# Patient Record
Sex: Female | Born: 1965
Health system: Southern US, Community
[De-identification: ages and names within clinical notes are randomized; demographics above are authoritative.]

## PROBLEM LIST (undated history)

## (undated) DIAGNOSIS — T839XXA Unspecified complication of genitourinary prosthetic device, implant and graft, initial encounter: Secondary | ICD-10-CM

## (undated) DIAGNOSIS — N179 Acute kidney failure, unspecified: Secondary | ICD-10-CM

## (undated) DIAGNOSIS — R7301 Impaired fasting glucose: Secondary | ICD-10-CM

## (undated) DIAGNOSIS — E559 Vitamin D deficiency, unspecified: Secondary | ICD-10-CM

## (undated) HISTORY — DX: Impaired fasting glucose: R73.01

## (undated) HISTORY — DX: Unspecified complication of genitourinary prosthetic device, implant and graft, initial encounter: T83.9XXA

## (undated) HISTORY — DX: Acute kidney failure, unspecified: N17.9

## (undated) HISTORY — DX: Vitamin D deficiency, unspecified: E55.9

---

## 2013-07-14 ENCOUNTER — Ambulatory Visit: Payer: Self-pay | Admitting: General Surgery

## 2013-07-23 ENCOUNTER — Ambulatory Visit: Payer: Self-pay | Admitting: General Surgery

## 2013-07-23 ENCOUNTER — Encounter: Payer: Self-pay | Admitting: *Deleted

## 2014-07-01 ENCOUNTER — Telehealth: Payer: Self-pay

## 2014-07-01 NOTE — Telephone Encounter (Signed)
Left message for patient stating that even thou she does not need a pap, they do recommened yearly physicals to make sure everything is working well and get her mammo scheduled. Left message for her asking to call our office to sch appt.

## 2014-07-01 NOTE — Telephone Encounter (Signed)
Patient left message stating that she was told last year that she didn't need another pap for 3 years. She wants to know if she still needs to come in for a CPE?

## 2014-07-24 DIAGNOSIS — E559 Vitamin D deficiency, unspecified: Secondary | ICD-10-CM | POA: Insufficient documentation

## 2014-07-24 DIAGNOSIS — R7303 Prediabetes: Secondary | ICD-10-CM | POA: Insufficient documentation

## 2014-07-27 ENCOUNTER — Encounter: Payer: Self-pay | Admitting: Family Medicine

## 2014-07-27 ENCOUNTER — Ambulatory Visit (INDEPENDENT_AMBULATORY_CARE_PROVIDER_SITE_OTHER): Payer: BLUE CROSS/BLUE SHIELD | Admitting: Family Medicine

## 2014-07-27 VITALS — BP 127/79 | HR 50 | Temp 97.8°F | Ht 62.5 in | Wt 142.0 lb

## 2014-07-27 DIAGNOSIS — Z1239 Encounter for other screening for malignant neoplasm of breast: Secondary | ICD-10-CM

## 2014-07-27 DIAGNOSIS — E559 Vitamin D deficiency, unspecified: Secondary | ICD-10-CM

## 2014-07-27 DIAGNOSIS — R7301 Impaired fasting glucose: Secondary | ICD-10-CM

## 2014-07-27 DIAGNOSIS — Z0000 Encounter for general adult medical examination without abnormal findings: Secondary | ICD-10-CM

## 2014-07-27 NOTE — Assessment & Plan Note (Signed)
Check level today 

## 2014-07-27 NOTE — Progress Notes (Signed)
Patient ID: Blima Singer, female   DOB: 16-Feb-1965, 49 y.o.   MRN: [id number]   Subjective:   Julie Warren is a 49 y.o. female here for a complete physical exam  Interim issues since last visit: nothing She needs mammogram She went for the screening test and then had the diagnostic test, and she was supposed to see a breast surgeon; she talked to the radiologist; was there for 2-3 hours; the radiologist told her that he felt 100% comfortable with letting her go a year until her next test; she did not go see the breast surgeon based on his recommendation; he stressed yearly mammograms, really stressed that  Past Medical History  Diagnosis Date  . Vitamin D deficiency disease   . IFG (impaired fasting glucose)    Past Surgical History  Procedure Laterality Date  . Cesarean section     Family History  Problem Relation Age of Onset  . Cancer Mother     lung  . COPD Mother   . Diabetes Father   . Heart disease Father   . Hyperlipidemia Father   . Hypertension Father   . COPD Maternal Aunt    History  Substance Use Topics  . Smoking status: Never Smoker   . Smokeless tobacco: Never Used  . Alcohol Use: No   Review of Systems  Constitutional: Negative for fever and unexpected weight change.  HENT: Negative for nosebleeds.   Respiratory: Negative for cough and wheezing.   Cardiovascular: Negative for chest pain and leg swelling.  Gastrointestinal: Negative for blood in stool.  Endocrine: Positive for heat intolerance. Negative for cold intolerance and polydipsia.  Genitourinary: Negative for hematuria.  Musculoskeletal: Negative for joint swelling and arthralgias.  Allergic/Immunologic: Negative for environmental allergies.  Neurological: Negative for tremors.  Hematological: Negative for adenopathy. Does not bruise/bleed easily.  Psychiatric/Behavioral: Negative for dysphoric mood. The patient is not nervous/anxious.    Objective:   Filed Vitals:   07/27/14 1400   BP: 127/79  Pulse: 50  Temp: 97.8 F (36.6 C)  Height: 5' 2.5" (1.588 m)  Weight: 142 lb (64.411 kg)  SpO2: 100%   Body mass index is 25.54 kg/(m^2). Wt Readings from Last 3 Encounters:  07/27/14 142 lb (64.411 kg)  06/12/13 142 lb (64.411 kg)   Physical Exam  Constitutional: She appears well-developed and well-nourished. No distress.  HENT:  Head: Normocephalic and atraumatic.  Eyes: EOM are normal. No scleral icterus.  Neck: No thyromegaly present.  Cardiovascular: Normal rate, regular rhythm and normal heart sounds.   No murmur heard. Pulmonary/Chest: Effort normal and breath sounds normal. No respiratory distress. She has no wheezes. She exhibits no mass. Right breast exhibits no inverted nipple, no mass, no nipple discharge, no skin change and no tenderness. Left breast exhibits mass (slight fullness over the superior breast 12 o'clock region). Left breast exhibits no inverted nipple, no nipple discharge, no skin change and no tenderness. Breasts are symmetrical.  Abdominal: Soft. Bowel sounds are normal. She exhibits no distension.  Musculoskeletal: Normal range of motion. She exhibits no edema.  Neurological: She is alert. She exhibits normal muscle tone.  Skin: Skin is warm and dry. No rash noted. She is not diaphoretic. No cyanosis. No pallor.  Psychiatric: She has a normal mood and affect. Judgment and thought content normal. Her speech is not rapid and/or pressured (talkative, but redirectable). She is hyperactive. She is not agitated and not aggressive. Cognition and memory are normal.   Assessment/Plan:   Problem  List Items Addressed This Visit      Endocrine   IFG (impaired fasting glucose)    Check glucose and A1C        Other   Vitamin D deficiency disease    Check level today       Other Visit Diagnoses    Health maintenance examination    -  Primary    healthy living encouraged; age-appropriate counseling and guidance; she declined hiv screening;  mammogram ordered; labs ordered    Screening for breast cancer        Relevant Orders    MM Digital Screening       No orders of the defined types were placed in this encounter.   Orders Placed This Encounter  Procedures  . MM Digital Screening    Standing Status: Future     Number of Occurrences:      Standing Expiration Date: 10/27/2014    Order Specific Question:  Reason for Exam (SYMPTOM  OR DIAGNOSIS REQUIRED)    Answer:  screening    Order Specific Question:  Is the patient pregnant?    Answer:  No    Order Specific Question:  Preferred imaging location?    Answer:  [facility]    Follow up plan: No Follow-up on file.  An After Visit Summary was printed and given to the patient.

## 2014-07-27 NOTE — Patient Instructions (Addendum)
I've entered the order for your mammogram If you have not heard anything from my staff in a week about any orders/referrals/studies from today, please contact us here to follow-up [phone]   Health Maintenance Adopting a healthy lifestyle and getting preventive care can go a long way to promote health and wellness. Talk with your health care provider about what schedule of regular examinations is right for you. This is a good chance for you to check in with your provider about disease prevention and staying healthy. In between checkups, there are plenty of things you can do on your own. Experts have done a lot of research about which lifestyle changes and preventive measures are most likely to keep you healthy. Ask your health care provider for more information. WEIGHT AND DIET  Eat a healthy diet  Be sure to include plenty of vegetables, fruits, low-fat dairy products, and lean protein.  Do not eat a lot of foods high in solid fats, added sugars, or salt.  Get regular exercise. This is one of the most important things you can do for your health.  Most adults should exercise for at least 150 minutes each week. The exercise should increase your heart rate and make you sweat (moderate-intensity exercise).  Most adults should also do strengthening exercises at least twice a week. This is in addition to the moderate-intensity exercise.  Maintain a healthy weight  Body mass index (BMI) is a measurement that can be used to identify possible weight problems. It estimates body fat based on height and weight. Your health care provider can help determine your BMI and help you achieve or maintain a healthy weight.  For females 22 years of age and older:   A BMI below 18.5 is considered underweight.  A BMI of 18.5 to 24.9 is normal.  A BMI of 25 to 29.9 is considered overweight.  A BMI of 30 and above is considered obese.  Watch levels of cholesterol and blood lipids  You should start  having your blood tested for lipids and cholesterol at 49 years of age, then have this test every 5 years.  You may need to have your cholesterol levels checked more often if:  Your lipid or cholesterol levels are high.  You are older than 49 years of age.  You are at high risk for heart disease.  CANCER SCREENING   Lung Cancer  Lung cancer screening is recommended for adults 59-36 years old who are at high risk for lung cancer because of a history of smoking.  A yearly low-dose CT scan of the lungs is recommended for people who:  Currently smoke.  Have quit within the past 15 years.  Have at least a 30-pack-year history of smoking. A pack year is smoking an average of one pack of cigarettes a day for 1 year.  Yearly screening should continue until it has been 15 years since you quit.  Yearly screening should stop if you develop a health problem that would prevent you from having lung cancer treatment.  Breast Cancer  Practice breast self-awareness. This means understanding how your breasts normally appear and feel.  It also means doing regular breast self-exams. Let your health care provider know about any changes, no matter how small.  If you are in your 20s or 30s, you should have a clinical breast exam (CBE) by a health care provider every 1-3 years as part of a regular health exam.  If you are 2 or older, have a CBE  every year. Also consider having a breast X-ray (mammogram) every year.  If you have a family history of breast cancer, talk to your health care provider about genetic screening.  If you are at high risk for breast cancer, talk to your health care provider about having an MRI and a mammogram every year.  Breast cancer gene (BRCA) assessment is recommended for women who have family members with BRCA-related cancers. BRCA-related cancers include:  Breast.  Ovarian.  Tubal.  Peritoneal cancers.  Results of the assessment will determine the need for  genetic counseling and BRCA1 and BRCA2 testing. Cervical Cancer Routine pelvic examinations to screen for cervical cancer are no longer recommended for nonpregnant women who are considered low risk for cancer of the pelvic organs (ovaries, uterus, and vagina) and who do not have symptoms. A pelvic examination may be necessary if you have symptoms including those associated with pelvic infections. Ask your health care provider if a screening pelvic exam is right for you.   The Pap test is the screening test for cervical cancer for women who are considered at risk.  If you had a hysterectomy for a problem that was not cancer or a condition that could lead to cancer, then you no longer need Pap tests.  If you are older than 65 years, and you have had normal Pap tests for the past 10 years, you no longer need to have Pap tests.  If you have had past treatment for cervical cancer or a condition that could lead to cancer, you need Pap tests and screening for cancer for at least 20 years after your treatment.  If you no longer get a Pap test, assess your risk factors if they change (such as having a new sexual partner). This can affect whether you should start being screened again.  Some women have medical problems that increase their chance of getting cervical cancer. If this is the case for you, your health care provider may recommend more frequent screening and Pap tests.  The human papillomavirus (HPV) test is another test that may be used for cervical cancer screening. The HPV test looks for the virus that can cause cell changes in the cervix. The cells collected during the Pap test can be tested for HPV.  The HPV test can be used to screen women 58 years of age and older. Getting tested for HPV can extend the interval between normal Pap tests from three to five years.  An HPV test also should be used to screen women of any age who have unclear Pap test results.  After 49 years of age, women  should have HPV testing as often as Pap tests.  Colorectal Cancer  This type of cancer can be detected and often prevented.  Routine colorectal cancer screening usually begins at 49 years of age and continues through 49 years of age.  Your health care provider may recommend screening at an earlier age if you have risk factors for colon cancer.  Your health care provider may also recommend using home test kits to check for hidden blood in the stool.  A small camera at the end of a tube can be used to examine your colon directly (sigmoidoscopy or colonoscopy). This is done to check for the earliest forms of colorectal cancer.  Routine screening usually begins at age 30.  Direct examination of the colon should be repeated every 5-10 years through 49 years of age. However, you may need to be screened more often if  early forms of precancerous polyps or small growths are found. Skin Cancer  Check your skin from head to toe regularly.  Tell your health care provider about any new moles or changes in moles, especially if there is a change in a mole's shape or color.  Also tell your health care provider if you have a mole that is larger than the size of a pencil eraser.  Always use sunscreen. Apply sunscreen liberally and repeatedly throughout the day.  Protect yourself by wearing long sleeves, pants, a wide-brimmed hat, and sunglasses whenever you are outside. HEART DISEASE, DIABETES, AND HIGH BLOOD PRESSURE   Have your blood pressure checked at least every 1-2 years. High blood pressure causes heart disease and increases the risk of stroke.  If you are between 59 years and 46 years old, ask your health care provider if you should take aspirin to prevent strokes.  Have regular diabetes screenings. This involves taking a blood sample to check your fasting blood sugar level.  If you are at a normal weight and have a low risk for diabetes, have this test once every three years after 49 years  of age.  If you are overweight and have a high risk for diabetes, consider being tested at a younger age or more often. PREVENTING INFECTION  Hepatitis B  If you have a higher risk for hepatitis B, you should be screened for this virus. You are considered at high risk for hepatitis B if:  You were born in a country where hepatitis B is common. Ask your health care provider which countries are considered high risk.  Your parents were born in a high-risk country, and you have not been immunized against hepatitis B (hepatitis B vaccine).  You have HIV or AIDS.  You use needles to inject street drugs.  You live with someone who has hepatitis B.  You have had sex with someone who has hepatitis B.  You get hemodialysis treatment.  You take certain medicines for conditions, including cancer, organ transplantation, and autoimmune conditions. Hepatitis C  Blood testing is recommended for:  Everyone born from [birth year] through 1965.  Anyone with known risk factors for hepatitis C. Sexually transmitted infections (STIs)  You should be screened for sexually transmitted infections (STIs) including gonorrhea and chlamydia if:  You are sexually active and are younger than 49 years of age.  You are older than 49 years of age and your health care provider tells you that you are at risk for this type of infection.  Your sexual activity has changed since you were last screened and you are at an increased risk for chlamydia or gonorrhea. Ask your health care provider if you are at risk.  If you do not have HIV, but are at risk, it may be recommended that you take a prescription medicine daily to prevent HIV infection. This is called pre-exposure prophylaxis (PrEP). You are considered at risk if:  You are sexually active and do not regularly use condoms or know the HIV status of your partner(s).  You take drugs by injection.  You are sexually active with a partner who has HIV. Talk with your  health care provider about whether you are at high risk of being infected with HIV. If you choose to begin PrEP, you should first be tested for HIV. You should then be tested every 3 months for as long as you are taking PrEP.  PREGNANCY   If you are premenopausal and you may become pregnant, ask  your health care provider about preconception counseling.  If you may become pregnant, take 400 to 800 micrograms (mcg) of folic acid every day.  If you want to prevent pregnancy, talk to your health care provider about birth control (contraception). OSTEOPOROSIS AND MENOPAUSE   Osteoporosis is a disease in which the bones lose minerals and strength with aging. This can result in serious bone fractures. Your risk for osteoporosis can be identified using a bone density scan.  If you are 67 years of age or older, or if you are at risk for osteoporosis and fractures, ask your health care provider if you should be screened.  Ask your health care provider whether you should take a calcium or vitamin D supplement to lower your risk for osteoporosis.  Menopause may have certain physical symptoms and risks.  Hormone replacement therapy may reduce some of these symptoms and risks. Talk to your health care provider about whether hormone replacement therapy is right for you.  HOME CARE INSTRUCTIONS   Schedule regular health, dental, and eye exams.  Stay current with your immunizations.   Do not use any tobacco products including cigarettes, chewing tobacco, or electronic cigarettes.  If you are pregnant, do not drink alcohol.  If you are breastfeeding, limit how much and how often you drink alcohol.  Limit alcohol intake to no more than 1 drink per day for nonpregnant women. One drink equals 12 ounces of beer, 5 ounces of wine, or 1 ounces of hard liquor.  Do not use street drugs.  Do not share needles.  Ask your health care provider for help if you need support or information about quitting  drugs.  Tell your health care provider if you often feel depressed.  Tell your health care provider if you have ever been abused or do not feel safe at home. Document Released: 07/11/2010 Document Revised: 05/12/2013 Document Reviewed: 11/27/2012 [facility] Patient Information 2015 [place], [place]. This information is not intended to replace advice given to you by your health care provider. Make sure you discuss any questions you have with your health care provider.

## 2014-07-27 NOTE — Assessment & Plan Note (Signed)
Check glucose and A1C

## 2014-07-28 ENCOUNTER — Encounter: Payer: Self-pay | Admitting: Family Medicine

## 2014-07-28 LAB — CBC WITH DIFFERENTIAL/PLATELET
BASOS: 0 %
Basophils Absolute: 0 10*3/uL (ref 0.0–0.2)
EOS (ABSOLUTE): 0.2 10*3/uL (ref 0.0–0.4)
Eos: 2 %
Hematocrit: 41.8 % (ref 34.0–46.6)
Hemoglobin: 13.5 g/dL (ref 11.1–15.9)
Immature Grans (Abs): 0 10*3/uL (ref 0.0–0.1)
Immature Granulocytes: 0 %
Lymphocytes Absolute: 2.5 10*3/uL (ref 0.7–3.1)
Lymphs: 34 %
MCH: 28.6 pg (ref 26.6–33.0)
MCHC: 32.3 g/dL (ref 31.5–35.7)
MCV: 89 fL (ref 79–97)
MONOCYTES: 5 %
Monocytes Absolute: 0.4 10*3/uL (ref 0.1–0.9)
NEUTROS ABS: 4.4 10*3/uL (ref 1.4–7.0)
Neutrophils: 59 %
Platelets: 206 10*3/uL (ref 150–379)
RBC: 4.72 x10E6/uL (ref 3.77–5.28)
RDW: 13.4 % (ref 12.3–15.4)
WBC: 7.5 10*3/uL (ref 3.4–10.8)

## 2014-07-28 LAB — HGB A1C W/O EAG: Hgb A1c MFr Bld: 5.7 % — ABNORMAL HIGH (ref 4.8–5.6)

## 2014-07-28 LAB — COMPREHENSIVE METABOLIC PANEL
ALT: 9 IU/L (ref 0–32)
AST: 15 IU/L (ref 0–40)
Albumin/Globulin Ratio: 1.6 (ref 1.1–2.5)
Albumin: 4 g/dL (ref 3.5–5.5)
Alkaline Phosphatase: 72 IU/L (ref 39–117)
BUN/Creatinine Ratio: 11 (ref 9–23)
BUN: 8 mg/dL (ref 6–24)
Bilirubin Total: 0.6 mg/dL (ref 0.0–1.2)
CHLORIDE: 101 mmol/L (ref 97–108)
CO2: 21 mmol/L (ref 18–29)
Calcium: 8.8 mg/dL (ref 8.7–10.2)
Creatinine, Ser: 0.73 mg/dL (ref 0.57–1.00)
GFR calc Af Amer: 113 mL/min/{1.73_m2} (ref >59)
GFR, EST NON AFRICAN AMERICAN: 98 mL/min/{1.73_m2} (ref >59)
GLOBULIN, TOTAL: 2.5 g/dL (ref 1.5–4.5)
GLUCOSE: 86 mg/dL (ref 65–99)
Potassium: 4 mmol/L (ref 3.5–5.2)
Sodium: 141 mmol/L (ref 134–144)
Total Protein: 6.5 g/dL (ref 6.0–8.5)

## 2014-07-28 LAB — LIPID PANEL W/O CHOL/HDL RATIO
Cholesterol, Total: 190 mg/dL (ref 100–199)
HDL: 39 mg/dL — ABNORMAL LOW (ref >39)
LDL Calculated: 116 mg/dL — ABNORMAL HIGH (ref 0–99)
TRIGLYCERIDES: 175 mg/dL — AB (ref 0–149)
VLDL Cholesterol Cal: 35 mg/dL (ref 5–40)

## 2014-07-28 LAB — VITAMIN D 25 HYDROXY (VIT D DEFICIENCY, FRACTURES): Vit D, 25-Hydroxy: 24 ng/mL — ABNORMAL LOW (ref 30.0–100.0)

## 2014-07-28 LAB — TSH: TSH: 2.73 u[IU]/mL (ref 0.450–4.500)

## 2014-08-14 ENCOUNTER — Encounter: Payer: Self-pay | Admitting: Family Medicine

## 2014-08-24 ENCOUNTER — Telehealth: Payer: Self-pay

## 2014-08-24 NOTE — Telephone Encounter (Signed)
Pt called would like a call back to discuss most recent labs. Thanks.

## 2014-08-25 NOTE — Telephone Encounter (Signed)
Left message to call.

## 2014-08-26 NOTE — Telephone Encounter (Signed)
Patient wanted clarification on her labs, she had gotten her letter in the mail.

## 2016-08-18 LAB — HM PAP SMEAR: HM PAP: NORMAL

## 2017-03-07 ENCOUNTER — Inpatient Hospital Stay: Payer: BLUE CROSS/BLUE SHIELD

## 2017-03-07 ENCOUNTER — Other Ambulatory Visit: Payer: Self-pay

## 2017-03-07 ENCOUNTER — Emergency Department: Payer: BLUE CROSS/BLUE SHIELD

## 2017-03-07 ENCOUNTER — Inpatient Hospital Stay
Admission: EM | Admit: 2017-03-07 | Discharge: 2017-03-09 | DRG: 988 | Disposition: A | Discharge status: Home / Self Care | Payer: BLUE CROSS/BLUE SHIELD | Attending: Obstetrics and Gynecology | Admitting: Obstetrics and Gynecology

## 2017-03-07 ENCOUNTER — Encounter: Payer: Self-pay | Admitting: Emergency Medicine

## 2017-03-07 DIAGNOSIS — Z79899 Other long term (current) drug therapy: Secondary | ICD-10-CM | POA: Diagnosis not present

## 2017-03-07 DIAGNOSIS — N135 Crossing vessel and stricture of ureter without hydronephrosis: Secondary | ICD-10-CM | POA: Diagnosis not present

## 2017-03-07 DIAGNOSIS — N179 Acute kidney failure, unspecified: Secondary | ICD-10-CM | POA: Diagnosis present

## 2017-03-07 DIAGNOSIS — N289 Disorder of kidney and ureter, unspecified: Secondary | ICD-10-CM | POA: Diagnosis present

## 2017-03-07 DIAGNOSIS — N1330 Unspecified hydronephrosis: Secondary | ICD-10-CM | POA: Diagnosis not present

## 2017-03-07 DIAGNOSIS — D3911 Neoplasm of uncertain behavior of right ovary: Secondary | ICD-10-CM | POA: Diagnosis present

## 2017-03-07 DIAGNOSIS — N839 Noninflammatory disorder of ovary, fallopian tube and broad ligament, unspecified: Secondary | ICD-10-CM | POA: Diagnosis present

## 2017-03-07 DIAGNOSIS — C562 Malignant neoplasm of left ovary: Principal | ICD-10-CM | POA: Diagnosis present

## 2017-03-07 DIAGNOSIS — Z885 Allergy status to narcotic agent status: Secondary | ICD-10-CM | POA: Diagnosis not present

## 2017-03-07 DIAGNOSIS — R102 Pelvic and perineal pain: Secondary | ICD-10-CM

## 2017-03-07 DIAGNOSIS — B029 Zoster without complications: Secondary | ICD-10-CM | POA: Diagnosis present

## 2017-03-07 DIAGNOSIS — N838 Other noninflammatory disorders of ovary, fallopian tube and broad ligament: Secondary | ICD-10-CM

## 2017-03-07 HISTORY — DX: Acute kidney failure, unspecified: N17.9

## 2017-03-07 LAB — COMPREHENSIVE METABOLIC PANEL
ALBUMIN: 4.1 g/dL (ref 3.5–5.0)
ALT: 15 U/L (ref 14–54)
ANION GAP: 10 (ref 5–15)
AST: 34 U/L (ref 15–41)
Alkaline Phosphatase: 68 U/L (ref 38–126)
BUN: 16 mg/dL (ref 6–20)
CHLORIDE: 103 mmol/L (ref 101–111)
CO2: 22 mmol/L (ref 22–32)
Calcium: 9.2 mg/dL (ref 8.9–10.3)
Creatinine, Ser: 1.67 mg/dL — ABNORMAL HIGH (ref 0.44–1.00)
GFR calc Af Amer: 40 mL/min — ABNORMAL LOW (ref >60)
GFR calc non Af Amer: 34 mL/min — ABNORMAL LOW (ref >60)
GLUCOSE: 145 mg/dL — AB (ref 65–99)
POTASSIUM: 4 mmol/L (ref 3.5–5.1)
SODIUM: 135 mmol/L (ref 135–145)
Total Bilirubin: 1.4 mg/dL — ABNORMAL HIGH (ref 0.3–1.2)
Total Protein: 7.5 g/dL (ref 6.5–8.1)

## 2017-03-07 LAB — URINALYSIS, COMPLETE (UACMP) WITH MICROSCOPIC
BACTERIA UA: NONE SEEN
Bilirubin Urine: NEGATIVE
Glucose, UA: NEGATIVE mg/dL
KETONES UR: 20 mg/dL — AB
Leukocytes, UA: NEGATIVE
Nitrite: NEGATIVE
PROTEIN: NEGATIVE mg/dL
Specific Gravity, Urine: 1.041 — ABNORMAL HIGH (ref 1.005–1.030)
pH: 5 (ref 5.0–8.0)

## 2017-03-07 LAB — LIPASE, BLOOD: LIPASE: 20 U/L (ref 11–51)

## 2017-03-07 LAB — CBC
HEMATOCRIT: 39.6 % (ref 35.0–47.0)
HEMOGLOBIN: 13.2 g/dL (ref 12.0–16.0)
MCH: 28.8 pg (ref 26.0–34.0)
MCHC: 33.4 g/dL (ref 32.0–36.0)
MCV: 86.4 fL (ref 80.0–100.0)
Platelets: 169 10*3/uL (ref 150–440)
RBC: 4.58 MIL/uL (ref 3.80–5.20)
RDW: 13.6 % (ref 11.5–14.5)
WBC: 14.2 10*3/uL — ABNORMAL HIGH (ref 3.6–11.0)

## 2017-03-07 LAB — HEMOGLOBIN A1C
Hgb A1c MFr Bld: 5.6 % (ref 4.8–5.6)
Mean Plasma Glucose: 114.02 mg/dL

## 2017-03-07 LAB — POCT PREGNANCY, URINE: PREG TEST UR: NEGATIVE

## 2017-03-07 MED ORDER — MORPHINE SULFATE (PF) 4 MG/ML IV SOLN
4.0000 mg | Freq: Once | INTRAVENOUS | Status: AC
  Administered 2017-03-07: 4 mg via INTRAVENOUS
  Filled 2017-03-07: qty 1

## 2017-03-07 MED ORDER — HEPARIN SODIUM (PORCINE) 5000 UNIT/ML IJ SOLN
5000.0000 [IU] | Freq: Three times a day (TID) | INTRAMUSCULAR | Status: DC
  Administered 2017-03-07 – 2017-03-09 (×5): 5000 [IU] via SUBCUTANEOUS
  Filled 2017-03-07 (×5): qty 1

## 2017-03-07 MED ORDER — SODIUM CHLORIDE 0.9 % IV BOLUS (SEPSIS)
1000.0000 mL | Freq: Once | INTRAVENOUS | Status: AC
  Administered 2017-03-07: 1000 mL via INTRAVENOUS

## 2017-03-07 MED ORDER — SODIUM CHLORIDE 0.9 % IV SOLN
INTRAVENOUS | Status: DC
  Administered 2017-03-07: 17:00:00 via INTRAVENOUS

## 2017-03-07 MED ORDER — VALACYCLOVIR HCL 500 MG PO TABS
1000.0000 mg | ORAL_TABLET | Freq: Two times a day (BID) | ORAL | Status: DC
  Administered 2017-03-07 – 2017-03-09 (×4): 1000 mg via ORAL
  Filled 2017-03-07 (×5): qty 2

## 2017-03-07 MED ORDER — ONDANSETRON HCL 4 MG/2ML IJ SOLN
4.0000 mg | Freq: Four times a day (QID) | INTRAMUSCULAR | Status: DC | PRN
  Administered 2017-03-07 – 2017-03-08 (×3): 4 mg via INTRAVENOUS
  Filled 2017-03-07 (×3): qty 2

## 2017-03-07 MED ORDER — IOPAMIDOL (ISOVUE-300) INJECTION 61%
75.0000 mL | Freq: Once | INTRAVENOUS | Status: AC | PRN
  Administered 2017-03-07: 75 mL via INTRAVENOUS

## 2017-03-07 MED ORDER — ACETAMINOPHEN 325 MG PO TABS: 650.0000 mg | ORAL_TABLET | Freq: Four times a day (QID) | ORAL | Status: DC | PRN

## 2017-03-07 MED ORDER — ONDANSETRON HCL 4 MG PO TABS
4.0000 mg | ORAL_TABLET | Freq: Four times a day (QID) | ORAL | Status: DC | PRN
Start: 2017-03-07 — End: 2017-03-09
  Administered 2017-03-08 – 2017-03-09 (×2): 4 mg via ORAL
  Filled 2017-03-07 (×2): qty 1

## 2017-03-07 MED ORDER — HYDROCODONE-ACETAMINOPHEN 5-325 MG PO TABS
1.0000 | ORAL_TABLET | ORAL | Status: DC | PRN
  Administered 2017-03-07 – 2017-03-09 (×5): 2 via ORAL
  Filled 2017-03-07: qty 2
  Filled 2017-03-07: qty 1
  Filled 2017-03-07 (×3): qty 2
  Filled 2017-03-07: qty 1

## 2017-03-07 MED ORDER — VALACYCLOVIR HCL 500 MG PO TABS: 1000.0000 mg | ORAL_TABLET | Freq: Three times a day (TID) | ORAL | Status: DC

## 2017-03-07 MED ORDER — ONDANSETRON HCL 4 MG/2ML IJ SOLN
4.0000 mg | Freq: Once | INTRAMUSCULAR | Status: AC
  Administered 2017-03-07: 4 mg via INTRAVENOUS
  Filled 2017-03-07: qty 2

## 2017-03-07 MED ORDER — MORPHINE SULFATE (PF) 2 MG/ML IV SOLN
2.0000 mg | INTRAVENOUS | Status: DC | PRN
  Administered 2017-03-07 (×2): 2 mg via INTRAVENOUS
  Filled 2017-03-07 (×2): qty 1

## 2017-03-07 MED ORDER — ACETAMINOPHEN 650 MG RE SUPP: 650.0000 mg | Freq: Four times a day (QID) | RECTAL | Status: DC | PRN

## 2017-03-07 NOTE — ED Notes (Signed)
Pt returned from US at this time.

## 2017-03-07 NOTE — ED Notes (Signed)
First Nurse Note:  Patient in [facility] from [facility] with left sided upper abd. Pain with nausea.  States pain goes through to her back.

## 2017-03-07 NOTE — Progress Notes (Signed)
Patient ID: Julie Warren, female   DOB: 1965/08/07, 52 y.o.   MRN: [id number] Consult completed-dictated Ovarian mass involving left ureter . Will need transfer to [place] [facility] [place] service . Dr Santiago Glad accepting Transfer # [phone]. Transfer tomorrow after Dr Wilhelmina Mcardle and decides if she is going to perform stenting or nephrostomy on her .

## 2017-03-07 NOTE — ED Notes (Signed)
This RN supervised bimanual exam performed by Dr. Ouida Sills. Pt tolerated well.

## 2017-03-07 NOTE — ED Notes (Signed)
Pt given a phone at this time to call her husband. Apologized for and explained the delay. Pt states understanding.

## 2017-03-07 NOTE — ED Notes (Signed)
Dr. Posey Pronto and Dr. Erlene Quan at bedside at this time.

## 2017-03-07 NOTE — ED Provider Notes (Addendum)
[facility] Emergency Department Provider Note  ____________________________________________   First MD Initiated Contact with Patient 03/07/17 1025     (approximate)  I have reviewed the triage vital signs and the nursing notes.   HISTORY  Chief Complaint Abdominal Pain   HPI Julie Warren is a 52 y.o. female with vitamin D deficiency who is presenting with 24 hours of left lower quadrant abdominal pain.  Says the pain is intermittently radiating up to the left upper quadrant and through to the back.  She says the pain is 8 out of 10 and cramping at this time.  Says that she is unable to pass gas or move her bowels and has not moved her bowels since this past Saturday.  She says that she usually moves her bowels every 2-3 days.  Says that she also vomited once yesterday and then thereafter had dry heaves every time she tried to eat or drink anything.  She says that she has had a C-section but denies any other abdominal surgeries.  Past Medical History:  Diagnosis Date  . IFG (impaired fasting glucose)   . Vitamin D deficiency disease     Patient Active Problem List   Diagnosis Date Noted  . Acute renal failure (ARF) ([place]) 03/07/2017  . Vitamin D deficiency disease   . IFG (impaired fasting glucose)     Past Surgical History:  Procedure Laterality Date  . CESAREAN SECTION      Prior to Admission medications   Not on File    Allergies Percocet [oxycodone-acetaminophen]  Family History  Problem Relation Age of Onset  . Cancer Mother        lung  . COPD Mother   . Diabetes Father   . Heart disease Father   . Hyperlipidemia Father   . Hypertension Father   . COPD Maternal Aunt     Social History Social History   Tobacco Use  . Smoking status: Never Smoker  . Smokeless tobacco: Never Used  Substance Use Topics  . Alcohol use: No  . Drug use: No    Review of Systems  Constitutional: No fever/chills Eyes: No visual  changes. ENT: No sore throat. Cardiovascular: Denies chest pain. Respiratory: Denies shortness of breath. Gastrointestinal:  No diarrhea.  No constipation. Genitourinary: Negative for dysuria. Musculoskeletal: Negative for back pain. Skin: Negative for rash. Neurological: Negative for headaches, focal weakness or numbness.   ____________________________________________   PHYSICAL EXAM:  VITAL SIGNS: ED Triage Vitals  Enc Vitals Group     BP 03/07/17 0920 (!) 143/74     Pulse Rate 03/07/17 0920 (!) 56     Resp 03/07/17 0920 (!) 25     Temp 03/07/17 0920 98.1 F (36.7 C)     Temp Source 03/07/17 0920 Oral     SpO2 03/07/17 0920 100 %     Weight 03/07/17 0921 140 lb (63.5 kg)     Height 03/07/17 0921 5\' 3"  (1.6 m)     Head Circumference --      Peak Flow --      Pain Score 03/07/17 0920 7     Pain Loc --      Pain Edu? --      Excl. in [place]? --     Constitutional: Alert and oriented.  There is uncomfortable.  Patient lying on her abdomen over the side of the bed when I entered the room but is able to reposition herself to the exam on her back  lying flat. Eyes: Conjunctivae are normal.  Head: Atraumatic. Nose: No congestion/rhinnorhea. Mouth/Throat: Mucous membranes are moist.  Neck: No stridor.   Cardiovascular: Normal rate, regular rhythm. Grossly normal heart sounds.  Good peripheral circulation. Respiratory: Normal respiratory effort.  No retractions. Lungs CTAB. Gastrointestinal: Soft moderate left lower quadrant abdominal tenderness without rebound or guarding.  Mild left upper quadrant tenderness to palpation. No distention. No CVA tenderness. Musculoskeletal: No lower extremity tenderness nor edema.  No joint effusions. Neurologic:  Normal speech and language. No gross focal neurologic deficits are appreciated. Skin:  Skin is warm, dry and intact. No rash noted. Psychiatric: Mood and affect are normal. Speech and behavior are  normal.  ____________________________________________   LABS (all labs ordered are listed, but only abnormal results are displayed)  Labs Reviewed  COMPREHENSIVE METABOLIC PANEL - Abnormal; Notable for the following components:      Result Value   Glucose, Bld 145 (*)    Creatinine, Ser 1.67 (*)    Total Bilirubin 1.4 (*)    GFR calc non Af Amer 34 (*)    GFR calc Af Amer 40 (*)    All other components within normal limits  CBC - Abnormal; Notable for the following components:   WBC 14.2 (*)    All other components within normal limits  URINALYSIS, COMPLETE (UACMP) WITH MICROSCOPIC - Abnormal; Notable for the following components:   Color, Urine YELLOW (*)    APPearance CLEAR (*)    Specific Gravity, Urine 1.041 (*)    Hgb urine dipstick MODERATE (*)    Ketones, ur 20 (*)    Squamous Epithelial / LPF 0-5 (*)    All other components within normal limits  LIPASE, BLOOD  INHIBIN A  INHIBIN B  CEA  CA 125  BETA HCG QUANT (REF LAB)  POC URINE PREG, ED  POCT PREGNANCY, URINE  POC URINE PREG, ED   ____________________________________________  EKG   ____________________________________________  RADIOLOGY  Bilateral ovarian masses about 6 cm each.  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 ____________________________________________   PROCEDURES  Procedure(s) performed:   Procedures  Critical Care performed:   ____________________________________________   INITIAL IMPRESSION / ASSESSMENT AND PLAN / ED COURSE  Pertinent labs & imaging results that were available during my care of the patient were reviewed by me and considered in my medical decision making (see chart for details).  Differential diagnosis includes, but is not limited to, ovarian cyst, ovarian torsion, acute appendicitis, diverticulitis, urinary tract infection/pyelonephritis, endometriosis, bowel  obstruction, colitis, renal colic, gastroenteritis, hernia, fibroids, endometriosis, pregnancy related pain including ectopic pregnancy, etc. As part of my medical decision making, I reviewed the following data within the [place] chart reviewed   ----------------------------------------- 12:41 PM on 03/07/2017 -----------------------------------------  I discussed the case with Dr. Erlene Quan who says that she will evaluate the patient for the need of a nephrostomy.  Patient will be admitted to the hospital.  Pain not relieved with initial dose of morphine.  Will give repeat dose.  Also with renal insufficiency now likely secondary to obstruction.  Patient will be admitted to the hospital.  She is aware of the ovarian masses and the need for further workup.  Signed out to Dr. Posey Pronto.     ____________________________________________   FINAL CLINICAL IMPRESSION(S) / ED DIAGNOSES  Acute renal failure.  Ovarian masses.  Ureteral obstruction.    NEW MEDICATIONS STARTED DURING THIS  VISIT:  New Prescriptions   No medications on file     Note:  This document was prepared using Dragon voice recognition software and may include unintentional dictation errors.     Orbie Pyo, MD 03/07/17 1241  Edematous rash noted in the distribution extending from the left thoracic spine at around T8 around to the anterior.  There are no bullae.  Patient says that her husband currently has shingles.  She will be treated for shingles.    Orbie Pyo, MD 03/07/17 [phone]

## 2017-03-07 NOTE — ED Notes (Signed)
Pt's family at bedside at this time. Asking about room number. This RN apologized and explained delay due to critical patient. Pt states understanding, explained would have room number available after report was called. Pt states understanding. Will continue to monitor for further patient needs.

## 2017-03-07 NOTE — ED Triage Notes (Signed)
Patient presents to ED via POV from home with c/o left sided abdominal pain that began yesterday. Patient reports N/V and constipation. Patient panting in triage.

## 2017-03-07 NOTE — H&P (Signed)
[facility] [place] NAME: Julie Warren    MR#:  [id number]  DATE OF BIRTH:  [birth year]  DATE OF ADMISSION:  03/07/2017  PRIMARY CARE PHYSICIAN: Julie Courser, MD   REQUESTING/REFERRING PHYSICIAN: Orbie Warren  CHIEF COMPLAINT:   Chief Complaint  Patient presents with  . Abdominal Pain    HISTORY OF PRESENT ILLNESS: Julie Warren  is a 52 y.o. female with a known history of vitamin D deficiency and impaired fasting glucose who states that she started having left lower quadrant abdominal pain going to her back.  She states that the pain is sharp.  Is been going on for the past 6 few days.  Initially she thought it was related to something she ate.  She also had nausea and vomiting yesterday.  Patient patient had a CT scan of the abdomen done in the emergency room.  Which showed bilateral ovarian cystic lesions.  And compression of the ureter associated with acute renal failure.  Patient has not had any fevers chills PAST MEDICAL HISTORY:   Past Medical History:  Diagnosis Date  . IFG (impaired fasting glucose)   . Vitamin D deficiency disease     PAST SURGICAL HISTORY:  Past Surgical History:  Procedure Laterality Date  . CESAREAN SECTION      SOCIAL HISTORY:  Social History   Tobacco Use  . Smoking status: Never Smoker  . Smokeless tobacco: Never Used  Substance Use Topics  . Alcohol use: No    FAMILY HISTORY:  Family History  Problem Relation Age of Onset  . Cancer Mother        lung  . COPD Mother   . Diabetes Father   . Heart disease Father   . Hyperlipidemia Father   . Hypertension Father   . COPD Maternal Aunt     DRUG ALLERGIES:  Allergies  Allergen Reactions  . Percocet [Oxycodone-Acetaminophen] Nausea And Vomiting    REVIEW OF SYSTEMS:   CONSTITUTIONAL: No fever, fatigue or weakness.  EYES: No blurred or double vision.  EARS, NOSE, AND THROAT: No tinnitus or ear pain.  RESPIRATORY: No cough,  shortness of breath, wheezing or hemoptysis.  CARDIOVASCULAR: No chest pain, orthopnea, edema.  GASTROINTESTINAL: No nausea, vomiting, diarrhea or positive abdominal pain.  GENITOURINARY: No dysuria, hematuria.  ENDOCRINE: No polyuria, nocturia,  HEMATOLOGY: No anemia, easy bruising or bleeding SKIN: No rash or lesion. MUSCULOSKELETAL: No joint pain or arthritis.   NEUROLOGIC: No tingling, numbness, weakness.  PSYCHIATRY: No anxiety or depression.   MEDICATIONS AT HOME:  Prior to Admission medications   Not on File      PHYSICAL EXAMINATION:   VITAL SIGNS: Blood pressure (!) 145/88, pulse (!) 49, temperature 98.1 F (36.7 C), temperature source Oral, resp. rate (!) 25, height 5\' 3"  (1.6 m), weight 140 lb (63.5 kg), last menstrual period 03/01/2017, SpO2 100 %.  GENERAL:  51 y.o.-year-old patient lying in the bed with no acute distress.  EYES: Pupils equal, round, reactive to light and accommodation. No scleral icterus. Extraocular muscles intact.  HEENT: Head atraumatic, normocephalic. Oropharynx and nasopharynx clear.  NECK:  Supple, no jugular venous distention. No thyroid enlargement, no tenderness.  LUNGS: Normal breath sounds bilaterally, no wheezing, rales,rhonchi or crepitation. No use of accessory muscles of respiration.  CARDIOVASCULAR: S1, S2 normal. No murmurs, rubs, or gallops.  ABDOMEN: Soft, left lower quadrant tenderness  eXTREMITIES: No pedal edema, cyanosis, or clubbing.  NEUROLOGIC: Cranial nerves II through  XII are intact. Muscle strength 5/5 in all extremities. Sensation intact. Gait not checked.  PSYCHIATRIC: The patient is alert and oriented x 3.  SKIN: T10 dermatome rash LABORATORY PANEL:   CBC Recent Labs  Lab 03/07/17 0921  WBC 14.2*  HGB 13.2  HCT 39.6  PLT 169  MCV 86.4  MCH 28.8  MCHC 33.4  RDW 13.6   ------------------------------------------------------------------------------------------------------------------  Chemistries  Recent  Labs  Lab 03/07/17 0921  NA 135  K 4.0  CL 103  CO2 22  GLUCOSE 145*  BUN 16  CREATININE 1.67*  CALCIUM 9.2  AST 34  ALT 15  ALKPHOS 68  BILITOT 1.4*   ------------------------------------------------------------------------------------------------------------------ estimated creatinine clearance is 35.7 mL/min (A) (by C-G formula based on SCr of 1.67 mg/dL (H)). ------------------------------------------------------------------------------------------------------------------ No results for input(s): TSH, T4TOTAL, T3FREE, THYROIDAB in the last 72 hours.  Invalid input(s): FREET3   Coagulation profile No results for input(s): INR, PROTIME in the last 168 hours. ------------------------------------------------------------------------------------------------------------------- No results for input(s): DDIMER in the last 72 hours. -------------------------------------------------------------------------------------------------------------------  Cardiac Enzymes No results for input(s): CKMB, TROPONINI, MYOGLOBIN in the last 168 hours.  Invalid input(s): CK ------------------------------------------------------------------------------------------------------------------ Invalid input(s): POCBNP  ---------------------------------------------------------------------------------------------------------------  Urinalysis    Component Value Date/Time   COLORURINE YELLOW (A) 03/07/2017 0921   APPEARANCEUR CLEAR (A) 03/07/2017 0921   LABSPEC 1.041 (H) 03/07/2017 0921   PHURINE 5.0 03/07/2017 0921   GLUCOSEU NEGATIVE 03/07/2017 0921   HGBUR MODERATE (A) 03/07/2017 0921   BILIRUBINUR NEGATIVE 03/07/2017 0921   KETONESUR 20 (A) 03/07/2017 0921   PROTEINUR NEGATIVE 03/07/2017 0921   NITRITE NEGATIVE 03/07/2017 0921   LEUKOCYTESUR NEGATIVE 03/07/2017 0921     RADIOLOGY: Ct Abdomen Pelvis W Contrast  Result Date: 03/07/2017 CLINICAL DATA:  Acute onset of LEFT side abdominal  pain yesterday with nausea, vomiting and constipation EXAM: CT ABDOMEN AND PELVIS WITH CONTRAST TECHNIQUE: Multidetector CT imaging of the abdomen and pelvis was performed using the standard protocol following bolus administration of intravenous contrast. Sagittal and coronal MPR images reconstructed from axial data set. CONTRAST:  70mL ISOVUE-300 IOPAMIDOL (ISOVUE-300) INJECTION 61% IV. No oral contrast. COMPARISON:  None FINDINGS: Lower chest: Lung bases clear Hepatobiliary: Gallbladder and liver normal appearance Pancreas: Normal appearance Spleen: Normal appearance Adrenals/Urinary Tract: Adrenal glands and RIGHT kidney normal appearance. LEFT kidney appears mildly enlarged with delayed nephrogram and mild perinephric edema. Associated LEFT hydronephrosis and hydroureter deviation of the distal LEFT ureter around a LEFT ovarian mass, see below. Small pelvic phleboliths are seen within additional calcification image 69 which could be within or immediately adjacent to the distal LEFT ureter. Bladder unremarkable. Stomach/Bowel: Normal appendix. Stomach and bowel loops unremarkable. Vascular/Lymphatic: Aorta normal caliber.  No adenopathy. Reproductive: Retroverted uterus. BILATERAL cystic ovarian masses are identified. Cystic RIGHT ovarian mass with a septation 5.6 x 6.0 x 4.2 cm. Complex cystic mass with multiple irregular/thick septations and question nodularity within LEFT ovary, 5.7 x 3.6 x 4.6 cm. Findings are highly concerning for cystic ovarian neoplasm on the LEFT and indeterminate for cystic ovarian neoplasm on the RIGHT. Other: No free intraperitoneal air air or fluid. No definite peritoneal studding or omental infiltration. No hernia. Musculoskeletal: Nonspecific small low-attenuation foci within several vertebral bodies. IMPRESSION: BILATERAL complex cystic masses of the ovaries measuring up to 6.0 cm RIGHT and 5.7 cm LEFT, highly suspicious for a cystic LEFT ovarian neoplasm and indeterminate for  cystic RIGHT ovarian neoplasm; further evaluation by transabdominal and transvaginal sonography of the pelvis recommended. Distal LEFT ureteral obstruction with LEFT  hydronephrosis, hydroureter, impaired LEFT renal function, and LEFT perinephric edema, uncertain if due to the visualized LEFT ovarian mass or a potential small distal LEFT ureteral calculus. Few small nonspecific low-attenuation foci within several vertebral bodies, of uncertain significance. Electronically Signed   By: Lavonia Dana M.D.   On: 03/07/2017 11:59    EKG: No orders found for this or any previous visit.  IMPRESSION AND PLAN: Patient is a 52 year old white female presenting with abdominal pain  1.  Abdominal pain likely combination of herpes zoster as well as left-sided hydronephrosis and The herpes zoster will continue the valacyclovir that was started in the ED For the ovarian and hydronephrosis see below  2.  Acute renal failure with hydronephrosis urology has seen the patient further treatment based on urology  3.  Ovarian cystic masses I will obtain a GYN consult I will check beta hCG, CA 125 levels, CEA levels and in have been a and B levels  4.  Miscellaneous heparin for DVT prophylaxis       All the records are reviewed and case discussed with ED provider. Management plans discussed with the patient, family and they are in agreement.  CODE STATUS: Code Status History    This patient does not have a recorded code status. Please follow your organizational policy for patients in this situation.       TOTAL TIME TAKING CARE OF THIS PATIENT: 12minutes.    Dustin Flock M.D on 03/07/2017 at 1:50 PM  Between 7am to 6pm - Pager - [phone]  After 6pm go to [email or web address] - password [email or web address] [facility]  Office  [phone]  CC: Primary care physician; Julie Courser, MD

## 2017-03-07 NOTE — Progress Notes (Signed)
Pharmacist - Prescriber Communication  Valtrex order modified from 1 gm PO TID to 1 gm PO BID due to creatinine clearance 30 to 49 mL/min.  Shatima Zalar A. [place], [place].D., BCPS Clinical Pharmacist 03/07/17 14:58

## 2017-03-07 NOTE — ED Notes (Signed)
Patient to room 2 via [place].  Megan aware of placement in room.  Patient states she feels like her pain has moved.  Dr. Clearnce Hasten aware.

## 2017-03-07 NOTE — Consult Note (Signed)
Urology Consult  I have been asked to see the patient by Dr. Dineen Kid for evaluation and management of left flank pain/ left hydronephrosis.  Chief Complaint: left flank flank  History of Present Illness: Julie Warren is a 52 y.o. year old who is otherwise previously healthy presenting to the emergency room with acute left upper quadrant and back pain since early yesterday morning.  Her pain is located over her left anterior rib cage wrapping around to her left flank.  She notes that the pain is severe and relatively sudden onset.  It was associated with nausea and vomiting which resolved with Zofran yesterday evening.  She had difficulty tolerating anything by mouth since that time.  She did eat [place] food 2 days ago and initially felt that her pain was related to gas and indigestion.  As part of her workup in the emergency room for abdominal pain, she underwent CT abdomen pelvis with contrast demonstrating bilateral cystic lesions in the pelvis.  The lesion on the left side measures 5.7 centimeters with nodular components highly concerning for underlying ovarian neoplasm.  Additionally, she is noted to have left hydroureteronephrosis down to this level with a delayed nephrogram and mild left perinephric edema.  She does have multiple pelvic calcifications and there is a question of whether or not 1 of these calcifications is within the left distal ureter.  In addition to this, she has a mild leukocytosis of 14.  Her UA is unremarkable other than for blood but she is currently menstruating.  Her creatinine is elevated to 1.67 from her previous baseline of 0.6 eating back to 2016.  She is being admitted to the medical service with GYN consultation pending.   Past Medical History:  Diagnosis Date  . IFG (impaired fasting glucose)   . Vitamin D deficiency disease     Past Surgical History:  Procedure Laterality Date  . CESAREAN SECTION      Home Medications:  No outpatient  medications have been marked as taking for the 03/07/17 encounter [facility] Encounter).    Allergies:  Allergies  Allergen Reactions  . Percocet [Oxycodone-Acetaminophen] Nausea And Vomiting    Family History  Problem Relation Age of Onset  . Cancer Mother        lung  . COPD Mother   . Diabetes Father   . Heart disease Father   . Hyperlipidemia Father   . Hypertension Father   . COPD Maternal Aunt     Social History:  reports that  has never smoked. she has never used smokeless tobacco. She reports that she does not drink alcohol or use drugs.  ROS: A complete review of systems was performed.  All systems are negative except for pertinent findings as noted.  Physical Exam:  Vital signs in last 24 hours: Temp:  [98.1 F (36.7 C)] 98.1 F (36.7 C) (02/27 0920) Pulse Rate:  [46-56] 49 (02/27 1300) Resp:  [25] 25 (02/27 0920) BP: (118-145)/(60-88) 145/88 (02/27 1300) SpO2:  [99 %-100 %] 100 % (02/27 1300) Weight:  [140 lb (63.5 kg)] 140 lb (63.5 kg) (02/27 0921) Constitutional:  Alert and oriented, No acute distress HEENT: [place] AT, moist mucus membranes.  Trachea midline, no masses Cardiovascular: No clubbing, cyanosis, or edema. Respiratory: Normal respiratory effort, lungs clear bilaterally GI: Abdomen is soft, nondistended, minimal tenderness in the left lower quadrant with deep palpation. GU: No CVA tenderness Skin: Erythematous macular rash without vesicles extending from spine wrapping around in T8 dermatome distribution,  not crossing the midline. Neurologic: Grossly intact, no focal deficits, moving all 4 extremities Psychiatric: Normal mood and affect   Laboratory Data:  Recent Labs    03/07/17 0921  WBC 14.2*  HGB 13.2  HCT 39.6   Recent Labs    03/07/17 0921  NA 135  K 4.0  CL 103  CO2 22  GLUCOSE 145*  BUN 16  CREATININE 1.67*  CALCIUM 9.2    Radiologic Imaging: Ct Abdomen Pelvis W Contrast  Result Date: 03/07/2017 CLINICAL DATA:  Acute  onset of LEFT side abdominal pain yesterday with nausea, vomiting and constipation EXAM: CT ABDOMEN AND PELVIS WITH CONTRAST TECHNIQUE: Multidetector CT imaging of the abdomen and pelvis was performed using the standard protocol following bolus administration of intravenous contrast. Sagittal and coronal MPR images reconstructed from axial data set. CONTRAST:  60mL ISOVUE-300 IOPAMIDOL (ISOVUE-300) INJECTION 61% IV. No oral contrast. COMPARISON:  None FINDINGS: Lower chest: Lung bases clear Hepatobiliary: Gallbladder and liver normal appearance Pancreas: Normal appearance Spleen: Normal appearance Adrenals/Urinary Tract: Adrenal glands and RIGHT kidney normal appearance. LEFT kidney appears mildly enlarged with delayed nephrogram and mild perinephric edema. Associated LEFT hydronephrosis and hydroureter deviation of the distal LEFT ureter around a LEFT ovarian mass, see below. Small pelvic phleboliths are seen within additional calcification image 69 which could be within or immediately adjacent to the distal LEFT ureter. Bladder unremarkable. Stomach/Bowel: Normal appendix. Stomach and bowel loops unremarkable. Vascular/Lymphatic: Aorta normal caliber.  No adenopathy. Reproductive: Retroverted uterus. BILATERAL cystic ovarian masses are identified. Cystic RIGHT ovarian mass with a septation 5.6 x 6.0 x 4.2 cm. Complex cystic mass with multiple irregular/thick septations and question nodularity within LEFT ovary, 5.7 x 3.6 x 4.6 cm. Findings are highly concerning for cystic ovarian neoplasm on the LEFT and indeterminate for cystic ovarian neoplasm on the RIGHT. Other: No free intraperitoneal air air or fluid. No definite peritoneal studding or omental infiltration. No hernia. Musculoskeletal: Nonspecific small low-attenuation foci within several vertebral bodies. IMPRESSION: BILATERAL complex cystic masses of the ovaries measuring up to 6.0 cm RIGHT and 5.7 cm LEFT, highly suspicious for a cystic LEFT ovarian  neoplasm and indeterminate for cystic RIGHT ovarian neoplasm; further evaluation by transabdominal and transvaginal sonography of the pelvis recommended. Distal LEFT ureteral obstruction with LEFT hydronephrosis, hydroureter, impaired LEFT renal function, and LEFT perinephric edema, uncertain if due to the visualized LEFT ovarian mass or a potential small distal LEFT ureteral calculus. Few small nonspecific low-attenuation foci within several vertebral bodies, of uncertain significance. Electronically Signed   By: Lavonia Dana M.D.   On: 03/07/2017 11:59   CT scan was personally reviewed.  Impression/Plan: 52 year old female presenting with pelvic mass possible ovarian cancer with mass-effect on the left ureter with probable problems contributing to her current presentation.  1. Pelvic mass-highly concerning for a malignant ovarian neoplasm.  Markers pending.  GYN consult pending.  2. Left hydronephrosis-likely secondary to mass-effect from left pelvic mass.  There is questionable calcification around the location of the left distal ureter but she has multiple phleboliths.  I suspect this obstruction is somewhat chronic secondary to her pelvic mass and unlikely to be the source of her pain although this certainly cannot be completely ruled out.  She does have borderline leukocytosis.  Urine is otherwise unremarkable.   We did discuss urinary decompression today in the form of nephrostomy tube versus ureteral stent.  At this point time given other possible explanations for her pain as below, I prefer to see if her  renal function improved with hydration.  Suspect she will ultimately undergo resection of this mass, and in the short-term, urinary diversion may not be necessary once the mass was excised.  3.  Acute renal insufficiency-plan for repeat labs in a.m. with adequate hydration.  Suspect prerenal component as contributing factor to her overall decreased renal function in addition to urinary obstruction.   If her creatinine fails to improve overnight, will consider nephrostomy tube versus stent tomorrow morning.  Please keep patient n.p.o. at midnight with reevaluation in the morning.  4. Left flank pain-based on the distribution of her pain as well as the rash in the same exact distribution including over her left anterior rib cage, I am highly suspicious that her acute pain is from herpes zoster rather than her ureteral obstruction which is likely much more chronic.   Urology will continue to follow closely.  Case was discussed personally with Dr. Posey Pronto as well as Dr. Clearnce Hasten.   03/07/2017, 1:20 PM  Hollice Espy,  MD

## 2017-03-07 NOTE — ED Notes (Signed)
Dr. Ouida Sills at bedside at this time with patient.

## 2017-03-08 ENCOUNTER — Inpatient Hospital Stay: Payer: BLUE CROSS/BLUE SHIELD | Admitting: Anesthesiology

## 2017-03-08 ENCOUNTER — Encounter: Payer: Self-pay | Admitting: Anesthesiology

## 2017-03-08 ENCOUNTER — Encounter
Admission: EM | Disposition: A | Discharge status: Home / Self Care | Payer: Self-pay | Source: Home / Self Care | Attending: Obstetrics and Gynecology

## 2017-03-08 ENCOUNTER — Telehealth: Payer: Self-pay | Admitting: Obstetrics and Gynecology

## 2017-03-08 DIAGNOSIS — N1330 Unspecified hydronephrosis: Secondary | ICD-10-CM

## 2017-03-08 HISTORY — PX: CYSTOSCOPY W/ RETROGRADES: SHX1426

## 2017-03-08 HISTORY — PX: CYSTOSCOPY WITH STENT PLACEMENT: SHX5790

## 2017-03-08 LAB — CBC
HEMATOCRIT: 35.5 % (ref 35.0–47.0)
HEMOGLOBIN: 12 g/dL (ref 12.0–16.0)
MCH: 29.3 pg (ref 26.0–34.0)
MCHC: 33.9 g/dL (ref 32.0–36.0)
MCV: 86.3 fL (ref 80.0–100.0)
Platelets: 132 10*3/uL — ABNORMAL LOW (ref 150–440)
RBC: 4.11 MIL/uL (ref 3.80–5.20)
RDW: 13.5 % (ref 11.5–14.5)
WBC: 12.3 10*3/uL — AB (ref 3.6–11.0)

## 2017-03-08 LAB — CEA: CEA: 2.2 ng/mL (ref 0.0–4.7)

## 2017-03-08 LAB — MISC LABCORP TEST (SEND OUT): LABCORP TEST CODE: 81700

## 2017-03-08 LAB — BASIC METABOLIC PANEL
ANION GAP: 9 (ref 5–15)
BUN: 15 mg/dL (ref 6–20)
CALCIUM: 8.1 mg/dL — AB (ref 8.9–10.3)
CO2: 21 mmol/L — ABNORMAL LOW (ref 22–32)
CREATININE: 1.27 mg/dL — AB (ref 0.44–1.00)
Chloride: 106 mmol/L (ref 101–111)
GFR, EST AFRICAN AMERICAN: 56 mL/min — AB (ref >60)
GFR, EST NON AFRICAN AMERICAN: 48 mL/min — AB (ref >60)
Glucose, Bld: 123 mg/dL — ABNORMAL HIGH (ref 65–99)
Potassium: 3.7 mmol/L (ref 3.5–5.1)
SODIUM: 136 mmol/L (ref 135–145)

## 2017-03-08 LAB — INHIBIN A: INHIBIN-A: 8.7 pg/mL

## 2017-03-08 LAB — HIV ANTIBODY (ROUTINE TESTING W REFLEX): HIV SCREEN 4TH GENERATION: NONREACTIVE

## 2017-03-08 LAB — BETA HCG QUANT (REF LAB): hCG Quant: <1 m[IU]/mL

## 2017-03-08 LAB — CA 125: Cancer Antigen (CA) 125: 14.2 U/mL (ref 0.0–38.1)

## 2017-03-08 LAB — INHIBIN B: Inhibin B: 109.6 pg/mL — ABNORMAL HIGH (ref 0.0–16.9)

## 2017-03-08 SURGERY — CYSTOSCOPY, WITH RETROGRADE PYELOGRAM
Anesthesia: General | Site: Ureter | Laterality: Left | Wound class: Clean Contaminated

## 2017-03-08 MED ORDER — MIDAZOLAM HCL 2 MG/2ML IJ SOLN
INTRAMUSCULAR | Status: AC
  Filled 2017-03-08: qty 2

## 2017-03-08 MED ORDER — FENTANYL CITRATE (PF) 100 MCG/2ML IJ SOLN: 25.0000 ug | INTRAMUSCULAR | Status: DC | PRN

## 2017-03-08 MED ORDER — LACTATED RINGERS IV SOLN
INTRAVENOUS | Status: DC | PRN
  Administered 2017-03-08: 12:00:00 via INTRAVENOUS

## 2017-03-08 MED ORDER — IOTHALAMATE MEGLUMINE 43 % IV SOLN
INTRAVENOUS | Status: DC | PRN
  Administered 2017-03-08: 15 mL via URETHRAL

## 2017-03-08 MED ORDER — SUCCINYLCHOLINE CHLORIDE 20 MG/ML IJ SOLN
INTRAMUSCULAR | Status: DC | PRN
  Administered 2017-03-08: 100 mg via INTRAVENOUS

## 2017-03-08 MED ORDER — FENTANYL CITRATE (PF) 100 MCG/2ML IJ SOLN
INTRAMUSCULAR | Status: AC
  Filled 2017-03-08: qty 2

## 2017-03-08 MED ORDER — ONDANSETRON HCL 4 MG/2ML IJ SOLN: 4.0000 mg | Freq: Once | INTRAMUSCULAR | Status: DC | PRN

## 2017-03-08 MED ORDER — DEXAMETHASONE SODIUM PHOSPHATE 10 MG/ML IJ SOLN
INTRAMUSCULAR | Status: DC | PRN
  Administered 2017-03-08: 10 mg via INTRAVENOUS

## 2017-03-08 MED ORDER — FENTANYL CITRATE (PF) 100 MCG/2ML IJ SOLN
INTRAMUSCULAR | Status: DC | PRN
  Administered 2017-03-08: 50 ug via INTRAVENOUS

## 2017-03-08 MED ORDER — MIDAZOLAM HCL 2 MG/2ML IJ SOLN
INTRAMUSCULAR | Status: DC | PRN
  Administered 2017-03-08: 2 mg via INTRAVENOUS

## 2017-03-08 MED ORDER — PROPOFOL 10 MG/ML IV BOLUS
INTRAVENOUS | Status: DC | PRN
  Administered 2017-03-08: 150 mg via INTRAVENOUS

## 2017-03-08 MED ORDER — PROPOFOL 10 MG/ML IV BOLUS
INTRAVENOUS | Status: AC
  Filled 2017-03-08: qty 20

## 2017-03-08 MED ORDER — ONDANSETRON HCL 4 MG/2ML IJ SOLN
INTRAMUSCULAR | Status: DC | PRN
  Administered 2017-03-08: 4 mg via INTRAVENOUS

## 2017-03-08 MED ORDER — CEFAZOLIN SODIUM-DEXTROSE 2-4 GM/100ML-% IV SOLN
2.0000 g | INTRAVENOUS | Status: AC
  Administered 2017-03-08: 2 g via INTRAVENOUS
  Filled 2017-03-08: qty 100

## 2017-03-08 MED ORDER — LIDOCAINE HCL (PF) 2 % IJ SOLN
INTRAMUSCULAR | Status: AC
  Filled 2017-03-08: qty 10

## 2017-03-08 MED ORDER — LIDOCAINE HCL (CARDIAC) 20 MG/ML IV SOLN
INTRAVENOUS | Status: DC | PRN
  Administered 2017-03-08: 100 mg via INTRAVENOUS

## 2017-03-08 SURGICAL SUPPLY — 23 items
BACTOSHIELD CHG 4% 4OZ (MISCELLANEOUS) ×1
CATH URETL 5X70 OPEN END (CATHETERS) ×3 IMPLANT
CONRAY 43 FOR UROLOGY 50M (MISCELLANEOUS) ×3 IMPLANT
GLOVE BIO SURGEON STRL SZ7 (GLOVE) ×6 IMPLANT
GLOVE BIO SURGEON STRL SZ7.5 (GLOVE) ×3 IMPLANT
GOWN STRL REUS W/ TWL LRG LVL3 (GOWN DISPOSABLE) ×1 IMPLANT
GOWN STRL REUS W/ TWL LRG LVL4 (GOWN DISPOSABLE) ×1 IMPLANT
GOWN STRL REUS W/TWL LRG LVL3 (GOWN DISPOSABLE) ×2
GOWN STRL REUS W/TWL LRG LVL4 (GOWN DISPOSABLE) ×2
GOWN STRL REUS W/TWL XL LVL4 (GOWN DISPOSABLE) ×3 IMPLANT
KIT TURNOVER CYSTO (KITS) ×3 IMPLANT
PACK CYSTO AR (MISCELLANEOUS) ×3 IMPLANT
SCRUB CHG 4% DYNA-HEX 4OZ (MISCELLANEOUS) ×2 IMPLANT
SENSORWIRE 0.038 NOT ANGLED (WIRE) ×3
SET CYSTO W/LG BORE CLAMP LF (SET/KITS/TRAYS/PACK) ×3 IMPLANT
SOL .9 NS 3000ML IRR  AL (IV SOLUTION) ×2
SOL .9 NS 3000ML IRR UROMATIC (IV SOLUTION) ×1 IMPLANT
STENT URET 6FRX24 CONTOUR (STENTS) IMPLANT
STENT URET 6FRX26 CONTOUR (STENTS) IMPLANT
STENT URO INLAY 6FRX24CM (STENTS) ×3 IMPLANT
SURGILUBE 2OZ TUBE FLIPTOP (MISCELLANEOUS) ×3 IMPLANT
WATER STERILE IRR 1000ML POUR (IV SOLUTION) ×3 IMPLANT
WIRE SENSOR 0.038 NOT ANGLED (WIRE) ×1 IMPLANT

## 2017-03-08 NOTE — Progress Notes (Signed)
Assissted to ambulate to BR, voided 300cc salmon color urine.  Returned to bed and positioned for comfort.

## 2017-03-08 NOTE — Transfer of Care (Signed)
Immediate Anesthesia Transfer of Care Note  Patient: Julie Warren  Procedure(s) Performed: CYSTOSCOPY WITH RETROGRADE PYELOGRAM (Left Ureter) CYSTOSCOPY WITH STENT PLACEMENT (Left Ureter)  Patient Location: PACU  Anesthesia Type:General  Level of Consciousness: awake, alert  and oriented  Airway & Oxygen Therapy: Patient Spontanous Breathing and Patient connected to nasal cannula oxygen  Post-op Assessment: Report given to RN and Post -op Vital signs reviewed and stable  Post vital signs: Reviewed and stable  Last Vitals:  Vitals:   03/08/17 0818 03/08/17 1240  BP: 135/71 (P) 108/69  Pulse: (!) 54 75  Resp: 16 16  Temp: 36.6 C 36.7 C  SpO2:  (P) 100%    Last Pain:  Vitals:   03/08/17 1240  TempSrc: Tympanic  PainSc:          Complications: No apparent anesthesia complications

## 2017-03-08 NOTE — Progress Notes (Signed)
Obstetric and Gynecology  HD 2  Subjective  Patient doing well, no complaints, tolerating PO intake, tolerating pain with PO meds, ambulating without difficulty, voiding spontaneously.     Denies CP, SOB, F/C, N/V/D, or leg pain.  Presented to ED with left abd/back pain and N/V, constipation.  Found to have acute renal failure, bilateral pelvic masses, and left hydroureteronephrosis.  She was admitted, seen by both GYN and Urology.  She had a left ureteral stent placed today.  She also is believed to have started a herpes zoster outbreak/shingles T-10 dermatome, and started on valtrex.   She was requested to be transferred to [facility] for further eval/treatment but insurance denied transfer.  She will remain at [facility] for the duration of her stay and subsequent surgery.   Objective  Objective:   Vitals:   03/08/17 1254 03/08/17 1304 03/08/17 1357 03/08/17 1949  BP: 113/75 105/72 109/68 100/62  Pulse: 68 71  60  Resp: 16 20    Temp:  98.5 F (36.9 C) 98.8 F (37.1 C) 97.8 F (36.6 C)  TempSrc:    Oral  SpO2: 100% 100% 94% 98%  Weight:      Height:       Temp:  [97.8 F (36.6 C)-99.1 F (37.3 C)] 97.8 F (36.6 C) (02/28 1949) Pulse Rate:  [50-75] 60 (02/28 1949) Resp:  [16-20] 20 (02/28 1304) BP: (100-135)/(54-75) 100/62 (02/28 1949) SpO2:  [94 %-100 %] 98 % (02/28 1949) I/O last 3 completed shifts: In: 600 [I.V.:600] Out: 503 [Urine:501; Blood:2] Total I/O In: -  Out: 750 [Urine:750]  Intake/Output Summary (Last 24 hours) at 03/08/2017 2250 Last data filed at 03/08/2017 2003 Gross per 24 hour  Intake 600 ml  Output 1253 ml  Net -653 ml     Current Vital Signs 24h Vital Sign Ranges  T 97.8 F (36.6 C) Temp  Avg: 98.4 F (36.9 C)  Min: 97.8 F (36.6 C)  Max: 99.1 F (37.3 C)  BP 100/62 BP  Min: 100/62  Max: 135/71  HR 60 Pulse  Avg: 61.1  Min: 50  Max: 75  RR 20 Resp  Avg: 17.3  Min: 16  Max: 20  SaO2 98 % Room Air SpO2  Avg: 98.2 %  Min: 94 %  Max: 100 %            24 Hour I/O Current Shift I/O  Time Ins Outs 02/27 0701 - 02/28 0700 In: 0  Out: 201 [Urine:201] 02/28 1901 - 03/01 0700 In: -  Out: 750 [Urine:750]   General: NAD Cardiovascular: RRR, no murmurs Pulmonary: CTAB, normal respiratory effort Abdomen: Benign. Non-tender, +BS, no guarding. Extremities: No erythema or cords, no calf tenderness, with normal peripheral pulses.  Labs: Results for orders placed or performed during the hospital encounter of 03/07/17 (from the past 24 hour(s))  CBC     Status: Abnormal   Collection Time: 03/08/17  4:35 AM  Result Value Ref Range   WBC 12.3 (H) 3.6 - 11.0 K/uL   RBC 4.11 3.80 - 5.20 MIL/uL   Hemoglobin 12.0 12.0 - 16.0 g/dL   HCT 35.5 35.0 - 47.0 %   MCV 86.3 80.0 - 100.0 fL   MCH 29.3 26.0 - 34.0 pg   MCHC 33.9 32.0 - 36.0 g/dL   RDW 13.5 11.5 - 14.5 %   Platelets 132 (L) 150 - 440 K/uL  Basic metabolic panel     Status: Abnormal   Collection Time: 03/08/17  4:35 AM  Result Value Ref Range   Sodium 136 135 - 145 mmol/L   Potassium 3.7 3.5 - 5.1 mmol/L   Chloride 106 101 - 111 mmol/L   CO2 21 (L) 22 - 32 mmol/L   Glucose, Bld 123 (H) 65 - 99 mg/dL   BUN 15 6 - 20 mg/dL   Creatinine, Ser 1.27 (H) 0.44 - 1.00 mg/dL   Calcium 8.1 (L) 8.9 - 10.3 mg/dL   GFR calc non Af Amer 48 (L) >60 mL/min   GFR calc Af Amer 56 (L) >60 mL/min   Anion gap 9 5 - 15    Cultures: No results found for this or any previous visit.   Imaging: No new images today.  Assessment   52 y.o.  Hospital Day: 2 with pelvic pain, and bilateral pelvic masses and left-sided hydronephrosis   Plan   1. Will accept patient on our service 2. S/p ureteral stent placement by urology.  Patient describes relief.  Will recheck creatinine tomorrow  3. Bilateral pelvic masses.  Reviewed labs, ultrasound and CT scan today with patient.  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  Patient describes recent change  and irregularity in vaginal bleeding, although endometrial stripe is <76mm.    I discussed in detail with patient and her daughter the findings on imaging, and the tumor markers.  We agreed to proceed with surgery to remove the uterus, cervix, bilateral tubes and ovaries via laparoscopy with possible removal of lymph nodes in the pelvis and near the aorta, and possible need to make a large abdominal incision.  She was consented for this, as well as possible ureteral re-implantation. Patient understands that there is uncertainty in her diagnosis until the pathology report returns.  She consents to surgery after reviewing the risks, benefits, and alternatives.  She understands that there may be complications such as thromboembolic incident, damage to nearby organs, hemorrhage, and infection, also complications from anesthesia which cannot be predicted.    Consent signed today. Planned surgery: Robotic-assisted Total Laparoscopic Hysterectomy, Bilateral Salpingo-Oophorectomy, Possible pelvic and para-aortic lymphadenectomy, possible laparotomy, and possible ureteral re-implantation. Dr. Theora Gianotti will assist me with this surgery,  To be scheduled on March 6th  4. Disposition:  Her pain is improved with the ureteral stent, she is otherwise stable.  Plan on discharge tomorrow morning.    ----- Larey Days, MD Attending Obstetrician and Gynecologist [facility], Department of [facility]

## 2017-03-08 NOTE — Progress Notes (Signed)
Subjective: Patient reports pain is better s/p left ureter stenting  Objective: I have reviewed patient's vital signs and medications.  General: alert and cooperative Resp: clear to auscultation bilaterally Cardio: regular rate and rhythm, S1, S2 normal, no murmur, click, rub or gallop GI: soft, non-tender; bowel sounds normal; no masses,  no organomegaly Results for orders placed or performed during the hospital encounter of 03/07/17 (from the past 24 hour(s))  Inhibin A     Status: None   Collection Time: 03/07/17  5:40 PM  Result Value Ref Range   Inhibin-A 8.7 pg/mL  Inhibin B     Status: Abnormal   Collection Time: 03/07/17  5:40 PM  Result Value Ref Range   Inhibin B 109.6 (H) 0.0 - 16.9 pg/mL  CEA     Status: None   Collection Time: 03/07/17  5:40 PM  Result Value Ref Range   CEA 2.2 0.0 - 4.7 ng/mL  CA 125     Status: None   Collection Time: 03/07/17  5:40 PM  Result Value Ref Range   Cancer Antigen (CA) 125 14.2 0.0 - 38.1 U/mL  Beta hCG quant (ref lab)     Status: None   Collection Time: 03/07/17  5:40 PM  Result Value Ref Range   hCG Quant <1 mIU/mL  Miscellaneous [facility] test (send-out)     Status: None   Collection Time: 03/07/17  5:40 PM  Result Value Ref Range   [facility] test code [id number]    [facility] test name HUMAN EPIDIDYMIS PROTEIN 4    Misc [facility] result COMMENT   HIV antibody (Routine Testing)     Status: None   Collection Time: 03/07/17  5:40 PM  Result Value Ref Range   HIV Screen 4th Generation wRfx Non Reactive Non Reactive  Hemoglobin A1c     Status: None   Collection Time: 03/07/17  5:40 PM  Result Value Ref Range   Hgb A1c MFr Bld 5.6 4.8 - 5.6 %   Mean Plasma Glucose 114.02 mg/dL  CBC     Status: Abnormal   Collection Time: 03/08/17  4:35 AM  Result Value Ref Range   WBC 12.3 (H) 3.6 - 11.0 K/uL   RBC 4.11 3.80 - 5.20 MIL/uL   Hemoglobin 12.0 12.0 - 16.0 g/dL   HCT 35.5 35.0 - 47.0 %   MCV 86.3 80.0 - 100.0 fL   MCH 29.3 26.0 - 34.0 pg    MCHC 33.9 32.0 - 36.0 g/dL   RDW 13.5 11.5 - 14.5 %   Platelets 132 (L) 150 - 440 K/uL  Basic metabolic panel     Status: Abnormal   Collection Time: 03/08/17  4:35 AM  Result Value Ref Range   Sodium 136 135 - 145 mmol/L   Potassium 3.7 3.5 - 5.1 mmol/L   Chloride 106 101 - 111 mmol/L   CO2 21 (L) 22 - 32 mmol/L   Glucose, Bld 123 (H) 65 - 99 mg/dL   BUN 15 6 - 20 mg/dL   Creatinine, Ser 1.27 (H) 0.44 - 1.00 mg/dL   Calcium 8.1 (L) 8.9 - 10.3 mg/dL   GFR calc non Af Amer 48 (L) >60 mL/min   GFR calc Af Amer 56 (L) >60 mL/min   Anion gap 9 5 - 15     Assessment/Plan: Bilateral ovarian mass with left involving left ureter .  Given insurance issues the pt will have robotic TLH/ BSO and possible staging . She is aware of the possibility  ureteral reimplantation at time of surgery . Drs Leonides Schanz and Theora Gianotti will be performing surgery next Wednesday  Possible d/c tomorrow with readmission for surgery   LOS: 1 day    Julie Warren 03/08/2017, 5:21 PM

## 2017-03-08 NOTE — Anesthesia Post-op Follow-up Note (Signed)
Anesthesia QCDR form completed.        

## 2017-03-08 NOTE — Consult Note (Signed)
Julie Warren, Julie Warren                ACCOUNT NO.:  [id number]  MEDICAL RECORD NO.:  [id number]  LOCATION:                                 FACILITY:  PHYSICIAN:  Laverta Baltimore, MD     DATE OF BIRTH:  DATE OF CONSULTATION:  03/07/2017 DATE OF DISCHARGE:                                CONSULTATION   REQUESTING PHYSICIAN:  Dr. Posey Pronto.  HISTORY OF PRESENT ILLNESS:  A 52 year old gravida 1, para 1 patient with a 1-day history of left lower quadrant pain, some nausea and vomiting.  The patient has had 6 months of early satiety, presented to the emergency department and underwent a CT scan and pelvic ultrasound. CT scan shows left hydronephrosis and left hydroureter and a complex bilateral ovarian mass, more concerning is the left ovarian mass, it is multi-cystic and diverts the ureter.  The patient has no adenopathy and no free fluid.  Ultrasound confirms the multi-cystic solid septated left ovarian mass.  Serum creatinine of 1.86 and GFR 30 mL/minute.  CA-125 is pending.  The patient states that she does not have routine gynecologic care.  Her primary care doctor is from [place], [place].  The patient states she has had a Pap smear last year, which was normal.  No records available.  PAST MEDICAL HISTORY:  History of: 1. Vitamin D deficiency. 2. Impaired fasting glucose.  PAST SURGICAL HISTORY:  Cesarean section in 1987.  SOCIAL HISTORY:  Does not smoke.  Does not drink.  FAMILY HISTORY:  No gynecologic cancers.  ALLERGIES:  PERCOCET, NAUSEA AND VOMITING.  REVIEW OF SYSTEMS:  CONSTITUTIONAL:  No fever, fatigue, or weakness. EYES:  No blurred vision.  EARS, NOSE, THROAT:  No tinnitus or ear pain. RESPIRATORY:  No shortness of breath, chest pain.  No cough. CARDIOVASCULAR:  No chest pain or orthopnea.  GASTROINTESTINAL: Positive nausea and vomiting.  Positive early satiety.  Left lower quadrant abdominal pain.  GENITOURINARY:  No dysuria.  No hematuria.  No menorrhagia.  ENDOCRINE:   No polyuria or nocturia.  HEMATOLOGIC:  No anemia or easy bruisability.  SKIN:  Positive recent rash, left upper quadrant of the abdomen.  MUSCULOSKELETAL:  No joint pain or arthritis. NEUROLOGIC:  No tingling, numbness, weakness.  PSYCHIATRIC:  No anxiety or depression.  MEDICATIONS:  None.  PHYSICAL EXAMINATION:  GENERAL:  A well-developed, well-nourished white female, mentating well.  No acute distress. VITAL SIGNS:  Temperature 98.7, blood pressure 148/76, pulse rate 51, pulse ox 97% on room air. EYES:  Pupils equal, round, reactive to light. HEENT:  Head atraumatic, normocephalic.  Oropharynx clear.  Nasopharynx clear. NECK:  No jugular distention.  No adenopathy palpated. LUNGS:  Clear to auscultation bilaterally. CARDIOVASCULAR:  Regular rate and rhythm without murmur. ABDOMEN:  Soft, nondistended.  Mild tenderness to palpation in the left lower quadrant.  No definitive mass. BIMANUAL PELVIC:  No blood.  Cervix, no cervical motion tenderness. Uterus normal size, shape, contour.  Adnexa, left adnexal fullness with irregular shaped mass palpable.  Mild tenderness on palpation. RECTAL AND VAGINAL:  Deferred. SKIN:  Papular rash T10 dermatome. BACK:  Mild left CVA tenderness.  ASSESSMENT:  Complex bilateral ovarian cyst.  The  left ovarian cyst is concerning for malignant process given the appearance on ultrasound and involvement of the left ureter causing hydroureter and left hydronephrosis.  PLAN:  I have spoken to [facility], MD, Gyn Oncologist, [facility].  She has recommended the patient be transferred to [facility] for definitive surgery and care.  Dr. Erlene Quan, urologist, is evaluating the patient for possible stenting or nephrostomy placement.  Dr. Theora Gianotti recommends that she pursue this until tomorrow and after the procedure, then transfer the patient to [facility].           ______________________________ Laverta Baltimore, MD     TS/MEDQ  D:  03/07/2017  T:  03/08/2017  Job:  [id number]

## 2017-03-08 NOTE — Telephone Encounter (Signed)
Dr. Ouida Sills contacted me about Julie Warren, a 52 y.o. who presented with pelvic pain. Evaluation revealed bilateral cystic ovarian masses and left ureteral obstruction with azotemia. Initially my recommendation was to transfer to [facility] but do to financial/insurance issues that is not optimal for the patient. I have recommended they proceed with stent placement to preserve renal function. Plan for surgery next week with Dr. Ouida Sills. Based on his pelvic exam it sounds like a minimally invasive approach is reasonable but there is a high likelihood of converting to open. Plan for diagnostic laparoscopy with robotic assisted TLH, BSO, and possible laparotomy, staging node dissection, biopsies, and omentectomy based on intraoperative findings I will meet the patient in the Preoperative unit prior to surgery. I recommended subcutaneous heparin preop for VTE prophylaxis and preop antibiotics.  I spoke with Dr. Erlene Quan and she will place the ureteral stent today. She is available the day of surgery if needed. She will also discuss possibility of ureteral resection and bladder reimplantation with psoas hitch with the patient if needed.    Dr. Ouida Sills will discuss the plan with Julie Warren.   Gillis Ends, MD

## 2017-03-08 NOTE — Anesthesia Postprocedure Evaluation (Signed)
Anesthesia Post Note  Patient: Julie Warren  Procedure(s) Performed: CYSTOSCOPY WITH RETROGRADE PYELOGRAM (Left Ureter) CYSTOSCOPY WITH STENT PLACEMENT (Left Ureter)  Patient location during evaluation: PACU Anesthesia Type: General Level of consciousness: awake and alert and oriented Pain management: pain level controlled Vital Signs Assessment: post-procedure vital signs reviewed and stable Respiratory status: spontaneous breathing Cardiovascular status: blood pressure returned to baseline Anesthetic complications: no     Last Vitals:  Vitals:   03/08/17 1304 03/08/17 1357  BP: 105/72 109/68  Pulse: 71   Resp: 20   Temp: 36.9 C 37.1 C  SpO2: 100% 94%    Last Pain:  Vitals:   03/08/17 1304  TempSrc:   PainSc: 0-No pain                 Dusten Ellinwood

## 2017-03-08 NOTE — Anesthesia Preprocedure Evaluation (Addendum)
Anesthesia Evaluation  Patient identified by MRN, date of birth, ID band Patient awake    Reviewed: Allergy & Precautions, NPO status , Patient's Chart, lab work & pertinent test results  Airway Mallampati: II  TM Distance: >3 FB     Dental  (+) Teeth Intact   Pulmonary neg pulmonary ROS,    Pulmonary exam normal        Cardiovascular negative cardio ROS Normal cardiovascular exam     Neuro/Psych negative neurological ROS  negative psych ROS   GI/Hepatic negative GI ROS, Neg liver ROS,   Endo/Other  negative endocrine ROS  Renal/GU ARFRenal disease  Female GU complaint     Musculoskeletal negative musculoskeletal ROS (+)   Abdominal Normal abdominal exam  (+)   Peds negative pediatric ROS (+)  Hematology negative hematology ROS (+)   Anesthesia Other Findings Past Medical History: No date: IFG (impaired fasting glucose) No date: Vitamin D deficiency disease  Reproductive/Obstetrics                            Anesthesia Physical Anesthesia Plan  ASA: III and emergent  Anesthesia Plan: General   Post-op Pain Management:    Induction: Rapid sequence, Cricoid pressure planned and Intravenous  PONV Risk Score and Plan:   Airway Management Planned: Oral ETT  Additional Equipment:   Intra-op Plan:   Post-operative Plan: Extubation in OR  Informed Consent: I have reviewed the patients History and Physical, chart, labs and discussed the procedure including the risks, benefits and alternatives for the proposed anesthesia with the patient or authorized representative who has indicated his/her understanding and acceptance.   Dental advisory given  Plan Discussed with: CRNA and Surgeon  Anesthesia Plan Comments:         Anesthesia Quick Evaluation

## 2017-03-08 NOTE — Progress Notes (Signed)
[facility] [place] NAME: Julie Warren    MR#:  [id number]  DATE OF BIRTH:  03-Oct-1965  SUBJECTIVE:   Patient here due to abdominal pain and noted to have a ovarian mass and also noted to be in acute kidney injury. Patient also had some left upper quadrant abdominal pain which was apparently attributed to some shingles. Still having some abd. Pain but no N/V and it's improved since yesterday.   REVIEW OF SYSTEMS:    Review of Systems  Constitutional: Negative for chills and fever.  HENT: Negative for congestion and tinnitus.   Eyes: Negative for blurred vision and double vision.  Respiratory: Negative for cough, shortness of breath and wheezing.   Cardiovascular: Negative for chest pain, orthopnea and PND.  Gastrointestinal: Positive for abdominal pain. Negative for diarrhea, nausea and vomiting.  Genitourinary: Negative for dysuria and hematuria.  Neurological: Negative for dizziness, sensory change and focal weakness.  All other systems reviewed and are negative.   Nutrition: Regular  Tolerating Diet: Yes Tolerating PT: Ambulatory   DRUG ALLERGIES:   Allergies  Allergen Reactions  . Percocet [Oxycodone-Acetaminophen] Nausea And Vomiting    VITALS:  Blood pressure 105/72, pulse 71, temperature 98.5 F (36.9 C), resp. rate 20, height 5\' 3"  (1.6 m), weight 63.5 kg (140 lb), last menstrual period 03/01/2017, SpO2 100 %.  PHYSICAL EXAMINATION:   Physical Exam  GENERAL:  52 y.o.-year-old patient sitting up in chair in no acute distress.  EYES: Pupils equal, round, reactive to light and accommodation. No scleral icterus. Extraocular muscles intact.  HEENT: Head atraumatic, normocephalic. Oropharynx and nasopharynx clear.  NECK:  Supple, no jugular venous distention. No thyroid enlargement, no tenderness.  LUNGS: Normal breath sounds bilaterally, no wheezing, rales, rhonchi. No use of accessory muscles of respiration.   CARDIOVASCULAR: S1, S2 normal. No murmurs, rubs, or gallops.  ABDOMEN: Soft, left upper Quadrant tenderness but no rebound, rigidity, nondistended. Bowel sounds present. No organomegaly or mass.  EXTREMITIES: No cyanosis, clubbing or edema b/l.    NEUROLOGIC: Cranial nerves II through XII are intact. No focal Motor or sensory deficits b/l.   PSYCHIATRIC: The patient is alert and oriented x 3.  SKIN: No obvious rash, lesion, or ulcer.  No vesicular rash noted in the upper part of the left side of the abdomen or any raised areas.   LABORATORY PANEL:   CBC Recent Labs  Lab 03/08/17 0435  WBC 12.3*  HGB 12.0  HCT 35.5  PLT 132*   ------------------------------------------------------------------------------------------------------------------  Chemistries  Recent Labs  Lab 03/07/17 0921 03/08/17 0435  NA 135 136  K 4.0 3.7  CL 103 106  CO2 22 21*  GLUCOSE 145* 123*  BUN 16 15  CREATININE 1.67* 1.27*  CALCIUM 9.2 8.1*  AST 34  --   ALT 15  --   ALKPHOS 68  --   BILITOT 1.4*  --    ------------------------------------------------------------------------------------------------------------------  Cardiac Enzymes No results for input(s): TROPONINI in the last 168 hours. ------------------------------------------------------------------------------------------------------------------  RADIOLOGY:  US Pelvis Transvanginal Non-ob (tv Only)  Result Date: 03/07/2017 CLINICAL DATA:  Initial evaluation for pelvic mass, left lower quadrant pain. EXAM: TRANSABDOMINAL AND TRANSVAGINAL ULTRASOUND OF PELVIS DOPPLER ULTRASOUND OF OVARIES TECHNIQUE: Both transabdominal and transvaginal ultrasound examinations of the pelvis were performed. Transabdominal technique was performed for global imaging of the pelvis including uterus, ovaries, adnexal regions, and pelvic cul-de-sac. It was necessary to proceed with endovaginal exam following the transabdominal exam to visualize the  uterus and  ovaries. Color and duplex Doppler ultrasound was utilized to evaluate blood flow to the ovaries. COMPARISON:  Prior CT from earlier the same day. FINDINGS: Uterus Measurements: 7.3 x 4.6 x 5.3 cm. No fibroids or other mass visualized. Endometrium Thickness: 2.9 mm. No focal abnormality visualized. Trace free fluid noted within the endometrial cavity. Right ovary Measurements: 5.8 x 4.4 x 4.5 cm. 5.3 x 3.8 x 4.8 cm septated cystic mass seen arising from the right ovary. Single curvilinear internal septation measuring up to 3 mm present. No internal vascularity or solid components. Left ovary 5.8 x 3.3 x 4.6 cm complex multi septate mass essentially replaces the left ovary. No definite ovarian tissue identified. Scattered areas of internal vascularity with probable solid components. Pulsed Doppler evaluation of both ovaries demonstrates normal low-resistance arterial and venous waveforms. Other findings No abnormal free fluid. IMPRESSION: 1. Bilateral complex cystic adnexal masses as above, highly concerning on the left for ovarian neoplasm, somewhat more indeterminate on the right. Gynecologic referral for surgical consultation recommended. Additionally, further evaluation with dedicated pelvic MRI, with and without gadolinium, could be performed for further characterization as clinically desired. 2. No evidence for ovarian torsion. Electronically Signed   By: Jeannine Boga M.D.   On: 03/07/2017 14:43   US Pelvis Complete  Result Date: 03/07/2017 CLINICAL DATA:  Pelvic mass. LEFT LOWER QUADRANT pain for 1 day. Previous C-section. EXAM: TRANSABDOMINAL AND TRANSVAGINAL ULTRASOUND OF PELVIS DOPPLER ULTRASOUND OF OVARIES TECHNIQUE: Both transabdominal and transvaginal ultrasound examinations of the pelvis were performed. Transabdominal technique was performed for global imaging of the pelvis including uterus, ovaries, adnexal regions, and pelvic cul-de-sac. It was necessary to proceed with endovaginal exam  following the transabdominal exam to visualize the uterus, endometrium, and ovaries. Color and duplex Doppler ultrasound was utilized to evaluate blood flow to the ovaries. COMPARISON:  CT of the abdomen and pelvis 03/07/2017 FINDINGS: Uterus Measurements: 7.3 x 4.6 x 5.3 centimeters. No fibroids or other mass visualized. Endometrium Thickness: 2.9 millimeters.  Normal appearance. Right ovary Measurements: 5.8 x 4.4 x 4.5 centimeters. Within these ovary there is a cystic lesion with internal septation which measures 5.3 x 3.8 x 4.8 centimeters. Left ovary Measurements: 5.8 x 3.3 x 4.6 centimeters. Ovary is replaced by it a complex solid and cystic mass. Thick, irregular internal septations and solid components contain blood flow on Doppler evaluation. Pulsed Doppler evaluation of both ovaries demonstrates normal low-resistance arterial and venous waveforms. Other findings No abnormal free fluid. IMPRESSION: 1. Complex solid and cystic mass in the LEFT adnexal region, measuring 5.8 centimeters and suspicious for neoplasm given the presence of solid components and internal vascularity. 2. Septated cystic lesion in the RIGHT adnexa possibly representing neoplasm or physiologic mass. 3. Recommend MRI and/or surgical evaluation. If further imaging or surgical exploration is not performed, I would recommend close follow-up in 6-8 weeks. 4. No evidence for torsion. Electronically Signed   By: Nolon Nations M.D.   On: 03/07/2017 14:14   Ct Abdomen Pelvis W Contrast  Result Date: 03/07/2017 CLINICAL DATA:  Acute onset of LEFT side abdominal pain yesterday with nausea, vomiting and constipation EXAM: CT ABDOMEN AND PELVIS WITH CONTRAST TECHNIQUE: Multidetector CT imaging of the abdomen and pelvis was performed using the standard protocol following bolus administration of intravenous contrast. Sagittal and coronal MPR images reconstructed from axial data set. CONTRAST:  47mL ISOVUE-300 IOPAMIDOL (ISOVUE-300) INJECTION  61% IV. No oral contrast. COMPARISON:  None FINDINGS: Lower chest: Lung bases clear Hepatobiliary: Gallbladder and  liver normal appearance Pancreas: Normal appearance Spleen: Normal appearance Adrenals/Urinary Tract: Adrenal glands and RIGHT kidney normal appearance. LEFT kidney appears mildly enlarged with delayed nephrogram and mild perinephric edema. Associated LEFT hydronephrosis and hydroureter deviation of the distal LEFT ureter around a LEFT ovarian mass, see below. Small pelvic phleboliths are seen within additional calcification image 69 which could be within or immediately adjacent to the distal LEFT ureter. Bladder unremarkable. Stomach/Bowel: Normal appendix. Stomach and bowel loops unremarkable. Vascular/Lymphatic: Aorta normal caliber.  No adenopathy. Reproductive: Retroverted uterus. BILATERAL cystic ovarian masses are identified. Cystic RIGHT ovarian mass with a septation 5.6 x 6.0 x 4.2 cm. Complex cystic mass with multiple irregular/thick septations and question nodularity within LEFT ovary, 5.7 x 3.6 x 4.6 cm. Findings are highly concerning for cystic ovarian neoplasm on the LEFT and indeterminate for cystic ovarian neoplasm on the RIGHT. Other: No free intraperitoneal air air or fluid. No definite peritoneal studding or omental infiltration. No hernia. Musculoskeletal: Nonspecific small low-attenuation foci within several vertebral bodies. IMPRESSION: BILATERAL complex cystic masses of the ovaries measuring up to 6.0 cm RIGHT and 5.7 cm LEFT, highly suspicious for a cystic LEFT ovarian neoplasm and indeterminate for cystic RIGHT ovarian neoplasm; further evaluation by transabdominal and transvaginal sonography of the pelvis recommended. Distal LEFT ureteral obstruction with LEFT hydronephrosis, hydroureter, impaired LEFT renal function, and LEFT perinephric edema, uncertain if due to the visualized LEFT ovarian mass or a potential small distal LEFT ureteral calculus. Few small nonspecific  low-attenuation foci within several vertebral bodies, of uncertain significance. Electronically Signed   By: Lavonia Dana M.D.   On: 03/07/2017 11:59   US Pelvic Doppler (torsion R/o Or Mass Arterial Flow)  Result Date: 03/07/2017 CLINICAL DATA:  Initial evaluation for pelvic mass, left lower quadrant pain. EXAM: TRANSABDOMINAL AND TRANSVAGINAL ULTRASOUND OF PELVIS DOPPLER ULTRASOUND OF OVARIES TECHNIQUE: Both transabdominal and transvaginal ultrasound examinations of the pelvis were performed. Transabdominal technique was performed for global imaging of the pelvis including uterus, ovaries, adnexal regions, and pelvic cul-de-sac. It was necessary to proceed with endovaginal exam following the transabdominal exam to visualize the uterus and ovaries. Color and duplex Doppler ultrasound was utilized to evaluate blood flow to the ovaries. COMPARISON:  Prior CT from earlier the same day. FINDINGS: Uterus Measurements: 7.3 x 4.6 x 5.3 cm. No fibroids or other mass visualized. Endometrium Thickness: 2.9 mm. No focal abnormality visualized. Trace free fluid noted within the endometrial cavity. Right ovary Measurements: 5.8 x 4.4 x 4.5 cm. 5.3 x 3.8 x 4.8 cm septated cystic mass seen arising from the right ovary. Single curvilinear internal septation measuring up to 3 mm present. No internal vascularity or solid components. Left ovary 5.8 x 3.3 x 4.6 cm complex multi septate mass essentially replaces the left ovary. No definite ovarian tissue identified. Scattered areas of internal vascularity with probable solid components. Pulsed Doppler evaluation of both ovaries demonstrates normal low-resistance arterial and venous waveforms. Other findings No abnormal free fluid. IMPRESSION: 1. Bilateral complex cystic adnexal masses as above, highly concerning on the left for ovarian neoplasm, somewhat more indeterminate on the right. Gynecologic referral for surgical consultation recommended. Additionally, further evaluation with  dedicated pelvic MRI, with and without gadolinium, could be performed for further characterization as clinically desired. 2. No evidence for ovarian torsion. Electronically Signed   By: Jeannine Boga M.D.   On: 03/07/2017 14:43     ASSESSMENT AND PLAN:   52 year old female with no significant past medical history presented to the hospital due  to abdominal pain and noted to have a complex cystic mass on the ovaries bilaterally with left-sided hydronephrosis.  1. Acute kidney injury-this is postobstructive given patient's left-sided hydronephrosis. This is secondary to her ovarian mass. Appreciate urology consult, patient is status post cystoscopy with left-sided ureteral stent placement today. -Continue IV fluids, creatinine improved since yesterday. -Continue to follow BUN and creatinine, renal dose meds, avoid nephrotoxins.  2. Bilateral complex cystic ovarian masses-suspicious for ovarian malignancy. -Appreciate OB/GYN consult and patient likely to need surgery next week. Plan was to possible transfer the patient to [facility] but due to insurance reasons that cannot be done. Patient to stay here and to be evaluated by OB/GYN oncologist see her next week. -Patient will be transferred over to the OB/GYN service.  3. Shingles- pt.  apparently had a vesicular raised rash yesterday. I did not appreciate that on exam today. -Continue Contac, airborne precautions, continue Valtrex.  Discussed plan of care extensively with urology, OB/GYN. Patient will be transferred to their service. Please call back if any further help needed.  All the records are reviewed and case discussed with Care Management/Social Worker. Management plans discussed with the patient, family and they are in agreement.  CODE STATUS: Full code  DVT Prophylaxis: Hep. SQ  TOTAL TIME TAKING CARE OF THIS PATIENT: 40 minutes.   POSSIBLE D/C IN 4-5 DAYS, DEPENDING ON CLINICAL CONDITION.   Henreitta Leber M.D on 03/08/2017 at  1:51 PM  Between 7am to 6pm - Pager - [phone]  After 6pm go to [email or web address] - Proofreader  [facility]  Office  [phone]  CC: Primary care physician; Arnetha Courser, MD

## 2017-03-08 NOTE — Progress Notes (Signed)
Urology Consult Follow Up  Subjective: Cr improving with hydration.  Rash and pain at sight of rash also improving.  Pain in left flank radiating to LLQ now more predominant.  No nausea or vomiting.  Currently n.p.o.  Concerned today that insurance does not cover transfer to [facility] (out of network)  Anti-infectives: Anti-infectives (From admission, onward)   Start     Dose/Rate Route Frequency Ordered Stop   03/07/17 1600  valACYclovir (VALTREX) tablet 1,000 mg  Status:  Discontinued     1,000 mg Oral 3 times daily 03/07/17 1317 03/07/17 1458   03/07/17 1500  valACYclovir (VALTREX) tablet 1,000 mg     1,000 mg Oral 2 times daily 03/07/17 1458 03/17/17 0959      Current Facility-Administered Medications  Medication Dose Route Frequency Provider Last Rate Last Dose  . 0.9 %  sodium chloride infusion   Intravenous Continuous Dustin Flock, MD 100 mL/hr at 03/07/17 1718    . acetaminophen (TYLENOL) tablet 650 mg  650 mg Oral Q6H PRN Dustin Flock, MD       Or  . acetaminophen (TYLENOL) suppository 650 mg  650 mg Rectal Q6H PRN Dustin Flock, MD      . heparin injection 5,000 Units  5,000 Units Subcutaneous Q8H Dustin Flock, MD   5,000 Units at 03/08/17 0602  . HYDROcodone-acetaminophen (NORCO/VICODIN) 5-325 MG per tablet 1-2 tablet  1-2 tablet Oral Q4H PRN Dustin Flock, MD   2 tablet at 03/08/17 0601  . morphine 2 MG/ML injection 2 mg  2 mg Intravenous Q4H PRN Dustin Flock, MD   2 mg at 03/07/17 2125  . ondansetron (ZOFRAN) tablet 4 mg  4 mg Oral Q6H PRN Dustin Flock, MD       Or  . ondansetron [facility]) injection 4 mg  4 mg Intravenous Q6H PRN Dustin Flock, MD   4 mg at 03/08/17 0601  . valACYclovir (VALTREX) tablet 1,000 mg  1,000 mg Oral BID Dustin Flock, MD   1,000 mg at 03/07/17 1610     Objective: Vital signs in last 24 hours: Temp:  [97.9 F (36.6 C)-100 F (37.8 C)] 97.9 F (36.6 C) (02/28 0818) Pulse Rate:  [46-59] 54 (02/28 0818) Resp:  [16-25] 16  (02/28 0818) BP: (99-148)/(54-88) 135/71 (02/28 0818) SpO2:  [97 %-100 %] 97 % (02/28 0346) Weight:  [140 lb (63.5 kg)] 140 lb (63.5 kg) (02/27 0921)  Intake/Output from previous day: 02/27 0701 - 02/28 0700 In: 0  Out: 201 [Urine:201] Intake/Output this shift: No intake/output data recorded.   Physical Exam  Acute distress.  Alert and oriented. Left thoracic dermatome rash now with dried vesicles, decreased erythema Positive left CVA tenderness and left lower quadrant pain No abdominal distention, rebound, or guarding  Lab Results:  Recent Labs    03/07/17 0921 03/08/17 0435  WBC 14.2* 12.3*  HGB 13.2 12.0  HCT 39.6 35.5  PLT 169 132*   BMET Recent Labs    03/07/17 0921 03/08/17 0435  NA 135 136  K 4.0 3.7  CL 103 106  CO2 22 21*  GLUCOSE 145* 123*  BUN 16 15  CREATININE 1.67* 1.27*  CALCIUM 9.2 8.1*   Studies/Results: US Pelvis Transvanginal Non-ob (tv Only)  Result Date: 03/07/2017 CLINICAL DATA:  Initial evaluation for pelvic mass, left lower quadrant pain. EXAM: TRANSABDOMINAL AND TRANSVAGINAL ULTRASOUND OF PELVIS DOPPLER ULTRASOUND OF OVARIES TECHNIQUE: Both transabdominal and transvaginal ultrasound examinations of the pelvis were performed. Transabdominal technique was performed for global imaging of the  pelvis including uterus, ovaries, adnexal regions, and pelvic cul-de-sac. It was necessary to proceed with endovaginal exam following the transabdominal exam to visualize the uterus and ovaries. Color and duplex Doppler ultrasound was utilized to evaluate blood flow to the ovaries. COMPARISON:  Prior CT from earlier the same day. FINDINGS: Uterus Measurements: 7.3 x 4.6 x 5.3 cm. No fibroids or other mass visualized. Endometrium Thickness: 2.9 mm. No focal abnormality visualized. Trace free fluid noted within the endometrial cavity. Right ovary Measurements: 5.8 x 4.4 x 4.5 cm. 5.3 x 3.8 x 4.8 cm septated cystic mass seen arising from the right ovary. Single  curvilinear internal septation measuring up to 3 mm present. No internal vascularity or solid components. Left ovary 5.8 x 3.3 x 4.6 cm complex multi septate mass essentially replaces the left ovary. No definite ovarian tissue identified. Scattered areas of internal vascularity with probable solid components. Pulsed Doppler evaluation of both ovaries demonstrates normal low-resistance arterial and venous waveforms. Other findings No abnormal free fluid. IMPRESSION: 1. Bilateral complex cystic adnexal masses as above, highly concerning on the left for ovarian neoplasm, somewhat more indeterminate on the right. Gynecologic referral for surgical consultation recommended. Additionally, further evaluation with dedicated pelvic MRI, with and without gadolinium, could be performed for further characterization as clinically desired. 2. No evidence for ovarian torsion. Electronically Signed   By: Jeannine Boga M.D.   On: 03/07/2017 14:43   US Pelvis Complete  Result Date: 03/07/2017 CLINICAL DATA:  Pelvic mass. LEFT LOWER QUADRANT pain for 1 day. Previous C-section. EXAM: TRANSABDOMINAL AND TRANSVAGINAL ULTRASOUND OF PELVIS DOPPLER ULTRASOUND OF OVARIES TECHNIQUE: Both transabdominal and transvaginal ultrasound examinations of the pelvis were performed. Transabdominal technique was performed for global imaging of the pelvis including uterus, ovaries, adnexal regions, and pelvic cul-de-sac. It was necessary to proceed with endovaginal exam following the transabdominal exam to visualize the uterus, endometrium, and ovaries. Color and duplex Doppler ultrasound was utilized to evaluate blood flow to the ovaries. COMPARISON:  CT of the abdomen and pelvis 03/07/2017 FINDINGS: Uterus Measurements: 7.3 x 4.6 x 5.3 centimeters. No fibroids or other mass visualized. Endometrium Thickness: 2.9 millimeters.  Normal appearance. Right ovary Measurements: 5.8 x 4.4 x 4.5 centimeters. Within these ovary there is a cystic lesion  with internal septation which measures 5.3 x 3.8 x 4.8 centimeters. Left ovary Measurements: 5.8 x 3.3 x 4.6 centimeters. Ovary is replaced by it a complex solid and cystic mass. Thick, irregular internal septations and solid components contain blood flow on Doppler evaluation. Pulsed Doppler evaluation of both ovaries demonstrates normal low-resistance arterial and venous waveforms. Other findings No abnormal free fluid. IMPRESSION: 1. Complex solid and cystic mass in the LEFT adnexal region, measuring 5.8 centimeters and suspicious for neoplasm given the presence of solid components and internal vascularity. 2. Septated cystic lesion in the RIGHT adnexa possibly representing neoplasm or physiologic mass. 3. Recommend MRI and/or surgical evaluation. If further imaging or surgical exploration is not performed, I would recommend close follow-up in 6-8 weeks. 4. No evidence for torsion. Electronically Signed   By: Nolon Nations M.D.   On: 03/07/2017 14:14   Ct Abdomen Pelvis W Contrast  Result Date: 03/07/2017 CLINICAL DATA:  Acute onset of LEFT side abdominal pain yesterday with nausea, vomiting and constipation EXAM: CT ABDOMEN AND PELVIS WITH CONTRAST TECHNIQUE: Multidetector CT imaging of the abdomen and pelvis was performed using the standard protocol following bolus administration of intravenous contrast. Sagittal and coronal MPR images reconstructed from axial data set. CONTRAST:  59mL ISOVUE-300 IOPAMIDOL (ISOVUE-300) INJECTION 61% IV. No oral contrast. COMPARISON:  None FINDINGS: Lower chest: Lung bases clear Hepatobiliary: Gallbladder and liver normal appearance Pancreas: Normal appearance Spleen: Normal appearance Adrenals/Urinary Tract: Adrenal glands and RIGHT kidney normal appearance. LEFT kidney appears mildly enlarged with delayed nephrogram and mild perinephric edema. Associated LEFT hydronephrosis and hydroureter deviation of the distal LEFT ureter around a LEFT ovarian mass, see below. Small  pelvic phleboliths are seen within additional calcification image 69 which could be within or immediately adjacent to the distal LEFT ureter. Bladder unremarkable. Stomach/Bowel: Normal appendix. Stomach and bowel loops unremarkable. Vascular/Lymphatic: Aorta normal caliber.  No adenopathy. Reproductive: Retroverted uterus. BILATERAL cystic ovarian masses are identified. Cystic RIGHT ovarian mass with a septation 5.6 x 6.0 x 4.2 cm. Complex cystic mass with multiple irregular/thick septations and question nodularity within LEFT ovary, 5.7 x 3.6 x 4.6 cm. Findings are highly concerning for cystic ovarian neoplasm on the LEFT and indeterminate for cystic ovarian neoplasm on the RIGHT. Other: No free intraperitoneal air air or fluid. No definite peritoneal studding or omental infiltration. No hernia. Musculoskeletal: Nonspecific small low-attenuation foci within several vertebral bodies. IMPRESSION: BILATERAL complex cystic masses of the ovaries measuring up to 6.0 cm RIGHT and 5.7 cm LEFT, highly suspicious for a cystic LEFT ovarian neoplasm and indeterminate for cystic RIGHT ovarian neoplasm; further evaluation by transabdominal and transvaginal sonography of the pelvis recommended. Distal LEFT ureteral obstruction with LEFT hydronephrosis, hydroureter, impaired LEFT renal function, and LEFT perinephric edema, uncertain if due to the visualized LEFT ovarian mass or a potential small distal LEFT ureteral calculus. Few small nonspecific low-attenuation foci within several vertebral bodies, of uncertain significance. Electronically Signed   By: Lavonia Dana M.D.   On: 03/07/2017 11:59   US Pelvic Doppler (torsion R/o Or Mass Arterial Flow)  Result Date: 03/07/2017 CLINICAL DATA:  Initial evaluation for pelvic mass, left lower quadrant pain. EXAM: TRANSABDOMINAL AND TRANSVAGINAL ULTRASOUND OF PELVIS DOPPLER ULTRASOUND OF OVARIES TECHNIQUE: Both transabdominal and transvaginal ultrasound examinations of the pelvis were  performed. Transabdominal technique was performed for global imaging of the pelvis including uterus, ovaries, adnexal regions, and pelvic cul-de-sac. It was necessary to proceed with endovaginal exam following the transabdominal exam to visualize the uterus and ovaries. Color and duplex Doppler ultrasound was utilized to evaluate blood flow to the ovaries. COMPARISON:  Prior CT from earlier the same day. FINDINGS: Uterus Measurements: 7.3 x 4.6 x 5.3 cm. No fibroids or other mass visualized. Endometrium Thickness: 2.9 mm. No focal abnormality visualized. Trace free fluid noted within the endometrial cavity. Right ovary Measurements: 5.8 x 4.4 x 4.5 cm. 5.3 x 3.8 x 4.8 cm septated cystic mass seen arising from the right ovary. Single curvilinear internal septation measuring up to 3 mm present. No internal vascularity or solid components. Left ovary 5.8 x 3.3 x 4.6 cm complex multi septate mass essentially replaces the left ovary. No definite ovarian tissue identified. Scattered areas of internal vascularity with probable solid components. Pulsed Doppler evaluation of both ovaries demonstrates normal low-resistance arterial and venous waveforms. Other findings No abnormal free fluid. IMPRESSION: 1. Bilateral complex cystic adnexal masses as above, highly concerning on the left for ovarian neoplasm, somewhat more indeterminate on the right. Gynecologic referral for surgical consultation recommended. Additionally, further evaluation with dedicated pelvic MRI, with and without gadolinium, could be performed for further characterization as clinically desired. 2. No evidence for ovarian torsion. Electronically Signed   By: Jeannine Boga M.D.   On: 03/07/2017 14:43  Assessment and plan:  1. Pelvic mass- highly concerning for possible malignant ovarian neoplasm.    Initially discussed transfer to [facility] today under the care of Dr. Theora Gianotti, however, patient concerned about insurance coverage at [facility].  Discussed  with Dr. Ouida Sills will discuss this further with Dr. Theora Gianotti in order to devise plan.  2. Left hydronephrosis-likely secondary to mass-effect from left pelvic mass.  There is questionable calcification around the location of the left distal ureter but she has multiple phleboliths.  I suspect this obstruction is somewhat chronic secondary to her pelvic mass and unlikely to be the source of her pain although this certainly cannot be completely ruled out.    Other than pain, there is no acute indication for stent placement at this time as her WBC and creatinine are both trending down.  She will be transferred to [facility], I would recommend waiting for stent placement which could be done at the same time as surgical resection in order to limit the number of procedures and anesthetics.  If patient will wait for transfer for outpatient evaluation by Dr. Theora Gianotti, would recommend proceeding with left ureteral stent today versus percutaneous nephrostomy tube.  Risk and benefits of each were discussed in detail.  I believe that she would benefit most from a stent which would help with intraoperative ureteral identification during surgery if stent is possible.  Risk and benefits discussed.  She is currently n.p.o.  Will wait to book surgery and possibly add on later today pending the plan as detailed above.  3.  Acute renal insufficiency-improving with hydration.  Likely also partially obstructive in nature.    LOS: 1 day    Hollice Espy 03/08/2017

## 2017-03-08 NOTE — Anesthesia Procedure Notes (Signed)
Procedure Name: Intubation Performed by: Demetrius Charity, CRNA Pre-anesthesia Checklist: Patient identified, Patient being monitored, Timeout performed, Emergency Drugs available and Suction available Patient Re-evaluated:Patient Re-evaluated prior to induction Oxygen Delivery Method: Circle system utilized Preoxygenation: Pre-oxygenation with 100% oxygen Induction Type: IV induction, Rapid sequence and Cricoid Pressure applied Ventilation: Mask ventilation without difficulty Laryngoscope Size: Mac and 3 Grade View: Grade III Tube type: Oral Tube size: 7.0 mm Number of attempts: 1 Airway Equipment and Method: Stylet Placement Confirmation: ETT inserted through vocal cords under direct vision,  positive ETCO2 and breath sounds checked- equal and bilateral Secured at: 21 cm Tube secured with: Tape Dental Injury: Teeth and Oropharynx as per pre-operative assessment

## 2017-03-08 NOTE — Plan of Care (Signed)
  Not Progressing Pain Managment: General experience of comfort will improve 03/08/2017 0216 - Not Progressing by Marylouise Stacks, RN  Complaining of pain. Morphine and vicodin ordered and given.

## 2017-03-08 NOTE — Op Note (Signed)
Date of procedure: 03/08/17  Preoperative diagnosis:  1. Left hydronephrosis 2. Pelvic mass  Postoperative diagnosis:  1. Same  Procedure: 1. Cystoscopy 2. Left retrograde pyelogram with interpretation 3. Left ureteral stent placement Bard Optima 6 French by 24 cm  Surgeon: Baruch Gouty, MD  Anesthesia: General  Complications: None  Intraoperative findings: The patient mild left hydroureteronephrosis on retrograde pyelogram.  She underwent successful left ureteral stent placement with good drainage of her upper tract upon stenting.  EBL: None  Specimens: None  Drains: 6 [place] by 24 cm Bard Optima left ureteral stent  Disposition: Stable to the postanesthesia care unit  Indication for procedure: The patient is a 52 y.o. female with with uncontrolled left flank pain, left hydronephrosis, and pelvic mass which is likely the source of the left hydronephrosis who presents today for urgent left ureteral stent placement due to uncontrolled pain.  After reviewing the management options for treatment, the patient elected to proceed with the above surgical procedure(s). We have discussed the potential benefits and risks of the procedure, side effects of the proposed treatment, the likelihood of the patient achieving the goals of the procedure, and any potential problems that might occur during the procedure or recuperation. Informed consent has been obtained.  Description of procedure: The patient was met in the preoperative area. All risks, benefits, and indications of the procedure were described in great detail. The patient consented to the procedure. Preoperative antibiotics were given. The patient was taken to the operative theater. General anesthesia was induced per the anesthesia service. The patient was then placed in the dorsal lithotomy position and prepped and draped in the usual sterile fashion. A preoperative timeout was called.   A 21 French 30 degree cystoscope was  introduced into the patient's bladder per urethra atraumatically.  The left ureteral orifice was identified.  With the aid of a dual lumen catheter, left retrograde pyelogram was obtained which showed mild hydroureteronephrosis.  A sensor wire was then advanced to the level of the left renal pelvis under fluoroscopy.  A 6 French by 24 cm Bard Optima left ureteral stent was then placed in the sensor wire removed.  A curl seen in the patient's left renal pelvis on the fluoroscopy and the urinary bladder with direct utilization.  The patient's bladder was then drained.  There is copious drainage of the patient's left upper tract noted at this time.  Patient is an awoke from anesthesia and transferred in stable condition to the postanesthesia care unit.  Plan: The patient is scheduled to undergo removal of her pelvic mass gynecology in the next week or so.  This stent will remain in place until after that procedure.  We will need to develop a plan after that time pending her prognosis for stent exchange versus removal.  Baruch Gouty, M.D.

## 2017-03-09 DIAGNOSIS — T839XXA Unspecified complication of genitourinary prosthetic device, implant and graft, initial encounter: Secondary | ICD-10-CM

## 2017-03-09 HISTORY — DX: Unspecified complication of genitourinary prosthetic device, implant and graft, initial encounter: T83.9XXA

## 2017-03-09 LAB — BASIC METABOLIC PANEL
Anion gap: 9 (ref 5–15)
BUN: 11 mg/dL (ref 6–20)
CALCIUM: 8.6 mg/dL — AB (ref 8.9–10.3)
CO2: 23 mmol/L (ref 22–32)
Chloride: 107 mmol/L (ref 101–111)
Creatinine, Ser: 0.95 mg/dL (ref 0.44–1.00)
GLUCOSE: 126 mg/dL — AB (ref 65–99)
Potassium: 3.2 mmol/L — ABNORMAL LOW (ref 3.5–5.1)
Sodium: 139 mmol/L (ref 135–145)

## 2017-03-09 MED ORDER — ONDANSETRON HCL 4 MG PO TABS: 4.0000 mg | ORAL_TABLET | Freq: Four times a day (QID) | ORAL | 1 refills | Status: DC | PRN

## 2017-03-09 MED ORDER — VALACYCLOVIR HCL 1 G PO TABS: 1000.0000 mg | ORAL_TABLET | Freq: Three times a day (TID) | ORAL | 0 refills | Status: DC

## 2017-03-09 MED ORDER — HYDROCODONE-ACETAMINOPHEN 5-325 MG PO TABS: 1.0000 | ORAL_TABLET | ORAL | 0 refills | Status: DC | PRN

## 2017-03-09 NOTE — Progress Notes (Signed)
Patient is being discharge home in a stable condition, summary and f/u care given , verbalized understanding .

## 2017-03-09 NOTE — Discharge Instructions (Signed)
You will either have surgery on Wednesday or an office visit with Dr. Theora Gianotti on Wednesday.  We will be in touch with you regarding this.  Keep ahead of your pain as much as possible.  Let us know if your pain is dramatically worsening.

## 2017-03-09 NOTE — Progress Notes (Signed)
Urology Consult Follow Up  Subjective: Flank pain dramatically improved this a.m.  Voiding without difficulty.  Creatinine down to baseline.  Anxious for discharge.  Anti-infectives: Anti-infectives (From admission, onward)   Start     Dose/Rate Route Frequency Ordered Stop   03/08/17 1014  ceFAZolin (ANCEF) IVPB 2g/100 mL premix     2 g 200 mL/hr over 30 Minutes Intravenous 60 min pre-op 03/08/17 1014 03/08/17 1230   03/07/17 1600  valACYclovir (VALTREX) tablet 1,000 mg  Status:  Discontinued     1,000 mg Oral 3 times daily 03/07/17 1317 03/07/17 1458   03/07/17 1500  valACYclovir (VALTREX) tablet 1,000 mg     1,000 mg Oral 2 times daily 03/07/17 1458 03/17/17 0959      Current Facility-Administered Medications  Medication Dose Route Frequency Provider Last Rate Last Dose  . 0.9 %  sodium chloride infusion   Intravenous Continuous Dustin Flock, MD   Stopped at 03/08/17 1202  . acetaminophen (TYLENOL) tablet 650 mg  650 mg Oral Q6H PRN Dustin Flock, MD       Or  . acetaminophen (TYLENOL) suppository 650 mg  650 mg Rectal Q6H PRN Dustin Flock, MD      . heparin injection 5,000 Units  5,000 Units Subcutaneous Q8H Dustin Flock, MD   5,000 Units at 03/09/17 [phone]  . HYDROcodone-acetaminophen (NORCO/VICODIN) 5-325 MG per tablet 1-2 tablet  1-2 tablet Oral Q4H PRN Dustin Flock, MD   2 tablet at 03/09/17 0441  . morphine 2 MG/ML injection 2 mg  2 mg Intravenous Q4H PRN Dustin Flock, MD   2 mg at 03/07/17 2125  . ondansetron (ZOFRAN) tablet 4 mg  4 mg Oral Q6H PRN Dustin Flock, MD   4 mg at 03/09/17 0440   Or  . ondansetron [facility]) injection 4 mg  4 mg Intravenous Q6H PRN Dustin Flock, MD   4 mg at 03/08/17 0601  . valACYclovir (VALTREX) tablet 1,000 mg  1,000 mg Oral BID Dustin Flock, MD   1,000 mg at 03/08/17 2155     Objective: Vital signs in last 24 hours: Temp:  [97.8 F (36.6 C)-98.8 F (37.1 C)] 98.5 F (36.9 C) (03/01 0756) Pulse Rate:  [50-75] 50  (03/01 0756) Resp:  [16-20] 20 (02/28 1304) BP: (100-132)/(62-75) 132/66 (03/01 0756) SpO2:  [94 %-100 %] 99 % (03/01 0516)  Intake/Output from previous day: 02/28 0701 - 03/01 0700 In: 1500 [I.V.:1500] Out: 1952 [Urine:1950; Blood:2] Intake/Output this shift: No intake/output data recorded.   Physical Exam  Acute distress.  Alert and oriented. Left thoracic dermatome rash now punctate scabs without erythema Minimal CVA tenderness on the left No abdominal distention, rebound, or guarding  Lab Results:  Recent Labs    03/07/17 0921 03/08/17 0435  WBC 14.2* 12.3*  HGB 13.2 12.0  HCT 39.6 35.5  PLT 169 132*   BMET Recent Labs    03/08/17 0435 03/09/17 0408  NA 136 139  K 3.7 3.2*  CL 106 107  CO2 21* 23  GLUCOSE 123* 126*  BUN 15 11  CREATININE 1.27* 0.95  CALCIUM 8.1* 8.6*   Studies/Results: US Pelvis Transvanginal Non-ob (tv Only)  Result Date: 03/07/2017 CLINICAL DATA:  Initial evaluation for pelvic mass, left lower quadrant pain. EXAM: TRANSABDOMINAL AND TRANSVAGINAL ULTRASOUND OF PELVIS DOPPLER ULTRASOUND OF OVARIES TECHNIQUE: Both transabdominal and transvaginal ultrasound examinations of the pelvis were performed. Transabdominal technique was performed for global imaging of the pelvis including uterus, ovaries, adnexal regions, and pelvic cul-de-sac. It was  necessary to proceed with endovaginal exam following the transabdominal exam to visualize the uterus and ovaries. Color and duplex Doppler ultrasound was utilized to evaluate blood flow to the ovaries. COMPARISON:  Prior CT from earlier the same day. FINDINGS: Uterus Measurements: 7.3 x 4.6 x 5.3 cm. No fibroids or other mass visualized. Endometrium Thickness: 2.9 mm. No focal abnormality visualized. Trace free fluid noted within the endometrial cavity. Right ovary Measurements: 5.8 x 4.4 x 4.5 cm. 5.3 x 3.8 x 4.8 cm septated cystic mass seen arising from the right ovary. Single curvilinear internal septation  measuring up to 3 mm present. No internal vascularity or solid components. Left ovary 5.8 x 3.3 x 4.6 cm complex multi septate mass essentially replaces the left ovary. No definite ovarian tissue identified. Scattered areas of internal vascularity with probable solid components. Pulsed Doppler evaluation of both ovaries demonstrates normal low-resistance arterial and venous waveforms. Other findings No abnormal free fluid. IMPRESSION: 1. Bilateral complex cystic adnexal masses as above, highly concerning on the left for ovarian neoplasm, somewhat more indeterminate on the right. Gynecologic referral for surgical consultation recommended. Additionally, further evaluation with dedicated pelvic MRI, with and without gadolinium, could be performed for further characterization as clinically desired. 2. No evidence for ovarian torsion. Electronically Signed   By: Jeannine Boga M.D.   On: 03/07/2017 14:43   US Pelvis Complete  Result Date: 03/07/2017 CLINICAL DATA:  Pelvic mass. LEFT LOWER QUADRANT pain for 1 day. Previous C-section. EXAM: TRANSABDOMINAL AND TRANSVAGINAL ULTRASOUND OF PELVIS DOPPLER ULTRASOUND OF OVARIES TECHNIQUE: Both transabdominal and transvaginal ultrasound examinations of the pelvis were performed. Transabdominal technique was performed for global imaging of the pelvis including uterus, ovaries, adnexal regions, and pelvic cul-de-sac. It was necessary to proceed with endovaginal exam following the transabdominal exam to visualize the uterus, endometrium, and ovaries. Color and duplex Doppler ultrasound was utilized to evaluate blood flow to the ovaries. COMPARISON:  CT of the abdomen and pelvis 03/07/2017 FINDINGS: Uterus Measurements: 7.3 x 4.6 x 5.3 centimeters. No fibroids or other mass visualized. Endometrium Thickness: 2.9 millimeters.  Normal appearance. Right ovary Measurements: 5.8 x 4.4 x 4.5 centimeters. Within these ovary there is a cystic lesion with internal septation which  measures 5.3 x 3.8 x 4.8 centimeters. Left ovary Measurements: 5.8 x 3.3 x 4.6 centimeters. Ovary is replaced by it a complex solid and cystic mass. Thick, irregular internal septations and solid components contain blood flow on Doppler evaluation. Pulsed Doppler evaluation of both ovaries demonstrates normal low-resistance arterial and venous waveforms. Other findings No abnormal free fluid. IMPRESSION: 1. Complex solid and cystic mass in the LEFT adnexal region, measuring 5.8 centimeters and suspicious for neoplasm given the presence of solid components and internal vascularity. 2. Septated cystic lesion in the RIGHT adnexa possibly representing neoplasm or physiologic mass. 3. Recommend MRI and/or surgical evaluation. If further imaging or surgical exploration is not performed, I would recommend close follow-up in 6-8 weeks. 4. No evidence for torsion. Electronically Signed   By: Nolon Nations M.D.   On: 03/07/2017 14:14   Ct Abdomen Pelvis W Contrast  Result Date: 03/07/2017 CLINICAL DATA:  Acute onset of LEFT side abdominal pain yesterday with nausea, vomiting and constipation EXAM: CT ABDOMEN AND PELVIS WITH CONTRAST TECHNIQUE: Multidetector CT imaging of the abdomen and pelvis was performed using the standard protocol following bolus administration of intravenous contrast. Sagittal and coronal MPR images reconstructed from axial data set. CONTRAST:  79mL ISOVUE-300 IOPAMIDOL (ISOVUE-300) INJECTION 61% IV. No oral contrast.  COMPARISON:  None FINDINGS: Lower chest: Lung bases clear Hepatobiliary: Gallbladder and liver normal appearance Pancreas: Normal appearance Spleen: Normal appearance Adrenals/Urinary Tract: Adrenal glands and RIGHT kidney normal appearance. LEFT kidney appears mildly enlarged with delayed nephrogram and mild perinephric edema. Associated LEFT hydronephrosis and hydroureter deviation of the distal LEFT ureter around a LEFT ovarian mass, see below. Small pelvic phleboliths are seen  within additional calcification image 69 which could be within or immediately adjacent to the distal LEFT ureter. Bladder unremarkable. Stomach/Bowel: Normal appendix. Stomach and bowel loops unremarkable. Vascular/Lymphatic: Aorta normal caliber.  No adenopathy. Reproductive: Retroverted uterus. BILATERAL cystic ovarian masses are identified. Cystic RIGHT ovarian mass with a septation 5.6 x 6.0 x 4.2 cm. Complex cystic mass with multiple irregular/thick septations and question nodularity within LEFT ovary, 5.7 x 3.6 x 4.6 cm. Findings are highly concerning for cystic ovarian neoplasm on the LEFT and indeterminate for cystic ovarian neoplasm on the RIGHT. Other: No free intraperitoneal air air or fluid. No definite peritoneal studding or omental infiltration. No hernia. Musculoskeletal: Nonspecific small low-attenuation foci within several vertebral bodies. IMPRESSION: BILATERAL complex cystic masses of the ovaries measuring up to 6.0 cm RIGHT and 5.7 cm LEFT, highly suspicious for a cystic LEFT ovarian neoplasm and indeterminate for cystic RIGHT ovarian neoplasm; further evaluation by transabdominal and transvaginal sonography of the pelvis recommended. Distal LEFT ureteral obstruction with LEFT hydronephrosis, hydroureter, impaired LEFT renal function, and LEFT perinephric edema, uncertain if due to the visualized LEFT ovarian mass or a potential small distal LEFT ureteral calculus. Few small nonspecific low-attenuation foci within several vertebral bodies, of uncertain significance. Electronically Signed   By: Lavonia Dana M.D.   On: 03/07/2017 11:59   US Pelvic Doppler (torsion R/o Or Mass Arterial Flow)  Result Date: 03/07/2017 CLINICAL DATA:  Initial evaluation for pelvic mass, left lower quadrant pain. EXAM: TRANSABDOMINAL AND TRANSVAGINAL ULTRASOUND OF PELVIS DOPPLER ULTRASOUND OF OVARIES TECHNIQUE: Both transabdominal and transvaginal ultrasound examinations of the pelvis were performed. Transabdominal  technique was performed for global imaging of the pelvis including uterus, ovaries, adnexal regions, and pelvic cul-de-sac. It was necessary to proceed with endovaginal exam following the transabdominal exam to visualize the uterus and ovaries. Color and duplex Doppler ultrasound was utilized to evaluate blood flow to the ovaries. COMPARISON:  Prior CT from earlier the same day. FINDINGS: Uterus Measurements: 7.3 x 4.6 x 5.3 cm. No fibroids or other mass visualized. Endometrium Thickness: 2.9 mm. No focal abnormality visualized. Trace free fluid noted within the endometrial cavity. Right ovary Measurements: 5.8 x 4.4 x 4.5 cm. 5.3 x 3.8 x 4.8 cm septated cystic mass seen arising from the right ovary. Single curvilinear internal septation measuring up to 3 mm present. No internal vascularity or solid components. Left ovary 5.8 x 3.3 x 4.6 cm complex multi septate mass essentially replaces the left ovary. No definite ovarian tissue identified. Scattered areas of internal vascularity with probable solid components. Pulsed Doppler evaluation of both ovaries demonstrates normal low-resistance arterial and venous waveforms. Other findings No abnormal free fluid. IMPRESSION: 1. Bilateral complex cystic adnexal masses as above, highly concerning on the left for ovarian neoplasm, somewhat more indeterminate on the right. Gynecologic referral for surgical consultation recommended. Additionally, further evaluation with dedicated pelvic MRI, with and without gadolinium, could be performed for further characterization as clinically desired. 2. No evidence for ovarian torsion. Electronically Signed   By: Jeannine Boga M.D.   On: 03/07/2017 14:43    Assessment and plan:  1. Pelvic mass-  highly concerning for possible ovarian neoplasm.    Plan for surgical resection of mass on Wednesday via robotic approach with Dr. Hedwig Morton. Ward.  Case discussed with Dr. Theora Gianotti personally.  Discussed with the patient the  possibility of need for ureteral resection and reimplantation at the time of surgery.  We discussed the anatomy extensively as well as this process.  Postoperative care was also discussed.  Risk and benefits were reviewed in detail.  All questions were answered.  Verbal consent for my portion of the procedure was obtained today in anticipation of surgery on Wednesday.  Consent was signed yesterday with Dr. Leonides Schanz.  2. Left hydronephrosis-likely secondary to mass-effect from left pelvic mass.  There is questionable calcification around the location of the left distal ureter but she has multiple phleboliths.    Postop day 1 status post left ureteral stent placement.  Minimal/mild left hydronephrosis without any evidence of stricture or filling defect at the time of stent placement.  Pain now improved.  Continue to suspect extrinsic compression rather than ureteral involvement.  3.  Acute renal insufficiency-improving with hydration/ stent.  Likely also partially obstructive in nature.  Appropriate for discharge from GU perspective.  Please discharged home with Flomax 0.4 mg daily as well as oxybutynin 5 mg p.o. 3 times daily as needed for bladder spasms.    LOS: 2 days    Hollice Espy 03/09/2017

## 2017-03-09 NOTE — Plan of Care (Signed)
  Education: Knowledge of General Education information will improve 03/09/2017 1011 - Progressing by Darrelyn Hillock, RN   Education: Knowledge of General Education information will improve 03/09/2017 1011 - Progressing by Darrelyn Hillock, RN

## 2017-03-09 NOTE — Discharge Summary (Signed)
Gynecology Physician Postoperative Discharge Summary  Patient ID: Julie Warren MRN: [id number] DOB/AGE: [id number] y.o.  Admit Date: 03/07/2017 Discharge Date: 03/09/2017  Admitting diagnosis: 1. Acute kidney injury 2. Hydroureteronephrosis 3. Bilateral pelvic masses 4. Shingles  Discharge diagnosis: same  Procedures: Procedure(s) (LRB): CYSTOSCOPY WITH RETROGRADE PYELOGRAM (Left) CYSTOSCOPY WITH STENT PLACEMENT (Left)  CBC Latest Ref Rng & Units 03/08/2017 03/07/2017 07/27/2014  WBC 3.6 - 11.0 K/uL 12.3(H) 14.2(H) 7.5  Hemoglobin 12.0 - 16.0 g/dL 12.0 13.2 13.5  Hematocrit 35.0 - 47.0 % 35.5 39.6 41.8  Platelets 150 - 440 K/uL 132(L) 169 206    Hospital Course:  Julie Warren is a 52 y.o. presented to ED with acute onset pelvic pain, found on CT to have bilateral adnexal masses and hydroureteronephrosis.  She was evaluated by GYN, Urology and [facility].  She was incidentally diagnosed with shingles in T-10 dermatome.  She was taken to OR by urology for left ureteral stent to relieve the AKI from mass effect from pelvic masses.  She underwent the procedures as mentioned above, her operation was uncomplicated. For further details about surgery, please refer to the operative report. Patient had an uncomplicated postoperative course. By time of discharge on POD#1, her pain was controlled on oral pain medications; she was ambulating, voiding without difficulty, tolerating regular diet and passing flatus. She was deemed stable for discharge to home.    Discharge Exam: Blood pressure 132/66, pulse (!) 50, temperature 98.5 F (36.9 C), temperature source Oral, resp. rate 20, height 5\' 3"  (1.6 m), weight 63.5 kg (140 lb), last menstrual period 03/01/2017, SpO2 99 %. General appearance: alert and no distress  Resp: clear to auscultation bilaterally, normal respiratory effort Cardio: regular rate and rhythm  GI: soft, non-tender; bowel sounds normal; no masses, no organomegaly.     Extremities: extremities normal, atraumatic, no cyanosis or edema and Homans sign is negative, no sign of DVT  Discharged Condition: Stable  Disposition: Final discharge disposition not confirmed She will either have surgery with Dr. Leonides Schanz on Wednesday 03/14/17 or 03/21/17.  This will be determined in the next few days.  Discharge Instructions    Diet - low sodium heart healthy   Complete by:  As directed    Increase activity slowly   Complete by:  As directed      Allergies as of 03/09/2017      Reactions   Percocet [oxycodone-acetaminophen] Nausea And Vomiting      Medication List    TAKE these medications   HYDROcodone-acetaminophen 5-325 MG tablet Commonly known as:  NORCO/VICODIN Take 1-2 tablets by mouth every 4 (four) hours as needed for moderate pain.   ondansetron 4 MG tablet Commonly known as:  ZOFRAN Take 1 tablet (4 mg total) by mouth every 6 (six) hours as needed for nausea.   valACYclovir 1000 MG tablet Commonly known as:  VALTREX Take 1 tablet (1,000 mg total) by mouth 3 (three) times daily.      Follow-up Information    Ward, Honor Loh, MD. Go on 03/12/2017.   Specialty:  Obstetrics and Gynecology Why:  @ 2:15pm Contact information: [place] [id number] [phone]        Gillis Ends, MD In 1 week.   Specialty:  Obstetrics and Gynecology Why:  on Wednesday at [place] information: [facility] [place] [id number] [phone]           Signed:  [place]  Attending [place] [facility]

## 2017-03-09 NOTE — Care Management (Signed)
There was discussion that patient would transfer to [facility] but this facility is not in network with her insurance.  [facility] is in network. Spoke with Dr Ouida Sills.  The physician that will be consulting on patient comes to [facility] weekly and will assess patient.  Any surgery at present will be performed at [facility] by the [facility] physician.  Per Dr Ouida Sills- he has checked and the physician ( Drs Ward and Theora Gianotti )that will be performing the surgery is in network with patient's insurance- even though comes from [facility]

## 2017-03-12 ENCOUNTER — Telehealth: Payer: Self-pay

## 2017-03-12 NOTE — Telephone Encounter (Signed)
Received call this am from Julie Warren asking for an appointment on 3/6 to see Dr. Theora Gianotti for upcoming surgery. She was notified that will are happy to see her on 3/6 but unfortunately we will not have a Therapist, nutritional here on 3/13 and Dr. Leonides Schanz would like to proceed with surgery. I have spoken with Levada Dy at [facility] and she will reach out to Julie Warren about all upcoming appointments and surgery on 3/8. We will assist in any way possible. Oncology Nurse Navigator Documentation  Navigator Location: [facility] (03/12/17 0900)   )Navigator Encounter Type: Telephone (03/12/17 0900) Telephone: Incoming Call (03/12/17 0900)                                                  Time Spent with Patient: 15 (03/12/17 0900)

## 2017-03-12 NOTE — Telephone Encounter (Signed)
After speaking with Dr. Leonides Schanz, she would still like for Dr. Theora Gianotti to see her on 3/6. I have spoke with Ms. Stage and appointment has been arranged with her. Directions to the cancer center given. Oncology Nurse Navigator Documentation  Navigator Location: [facility] (03/12/17 1100)   )Navigator Encounter Type: Telephone (03/12/17 1100) Telephone: Lahoma Crocker Call;Appt Confirmation/Clarification (03/12/17 1100)                                                  Time Spent with Patient: 15 (03/12/17 1100)

## 2017-03-13 ENCOUNTER — Telehealth: Payer: Self-pay

## 2017-03-13 NOTE — Progress Notes (Signed)
Gynecologic Oncology Consult Visit   Referring Provider: Dr. Laverta Baltimore Dr. Vikki Ports Ward  Chief Concern: Bilateral pelvic masses  Subjective:  Julie Warren is a 52 y.o. female who is seen in consultation from Drs. Schermerhorn and Ward for bilateral pelvic masses.   Julie Warren presented to ED with acute onset pelvic pain, found on CT to have bilateral adnexal masses and hydroureteronephrosis.    CT scan A/P 03/07/2017 BILATERAL complex cystic masses of the ovaries measuring up to 6.0 cm RIGHT and 5.7 cm LEFT, highly suspicious for a cystic LEFT ovarian neoplasm and indeterminate for cystic RIGHT ovarian neoplasm; further evaluation by transabdominal and transvaginal sonography of the pelvis recommended.  Distal LEFT ureteral obstruction with LEFT hydronephrosis, hydroureter, impaired LEFT renal function, and LEFT perinephric edema, uncertain if due to the visualized LEFT ovarian mass or a potential small distal LEFT ureteral calculus.  Few small nonspecific low-attenuation foci within several vertebral bodies, of uncertain significance.  US pelvis A/P 03/07/2017 IMPRESSION: 1. Bilateral complex cystic adnexal masses as above, highly concerning on the left for ovarian neoplasm, somewhat more indeterminate on the right. Gynecologic referral for surgical consultation recommended. Additionally, further evaluation with dedicated pelvic MRI, with and without gadolinium, could be performed for further characterization as clinically desired. 2. No evidence for ovarian torsion.   She was evaluated by GYN, Urology and [facility].  She was incidentally diagnosed with shingles in T-10 dermatome.  She was taken to OR by urology for left ureteral stent to relieve the AKI from mass effect from pelvic masses.  She underwent the procedures as mentioned above, her operation was uncomplicated. By time of discharge on POD#1, her pain was controlled on oral pain medications; she was  ambulating, voiding without difficulty, tolerating regular diet and passing flatus. She was deemed stable for discharge to home. The plan is for diagnostic laparoscopy possible THBSO on 03/16/2017 with Dr. Leonides Schanz.    CA125 = 14.2  CEA = 2.2 Inhibin B 109.6 -  May be elevated in premenopausal women.  Inhibin A 8.7  She presents today to discuss management options.   Problem List: Patient Active Problem List   Diagnosis Date Noted  . Pelvic mass 03/14/2017  . Acute renal failure (ARF) ([place]) 03/07/2017  . Vitamin D deficiency disease   . IFG (impaired fasting glucose)     Past Medical History: Past Medical History:  Diagnosis Date  . Disorder of urinary stent insertion ([place]) 03/2017  . IFG (impaired fasting glucose)   . Vitamin D deficiency disease     Past Surgical History: Past Surgical History:  Procedure Laterality Date  . CESAREAN SECTION    . CYSTOSCOPY W/ RETROGRADES Left 03/08/2017   Procedure: CYSTOSCOPY WITH RETROGRADE PYELOGRAM;  Surgeon: Nickie Retort, MD;  Location: [facility] ORS;  Service: Urology;  Laterality: Left;  . CYSTOSCOPY WITH STENT PLACEMENT Left 03/08/2017   Procedure: CYSTOSCOPY WITH STENT PLACEMENT;  Surgeon: Nickie Retort, MD;  Location: [facility] ORS;  Service: Urology;  Laterality: Left;    Past Gynecologic History:  As per HPI  OB History:  OB History  Gravida Para Term Preterm AB Living  1 1 1     1   SAB TAB Ectopic Multiple Live Births               # Outcome Date GA Lbr Len/2nd Weight Sex Delivery Anes PTL Lv  1 Term             Obstetric Comments  C-Section  Family History: Family History  Problem Relation Age of Onset  . Cancer Mother        lung  . COPD Mother   . Diabetes Father   . Heart disease Father   . Hyperlipidemia Father   . Hypertension Father   . COPD Maternal Aunt     Social History: Social History   Socioeconomic History  . Marital status: Married    Spouse name: Not on file  . Number of children:  Not on file  . Years of education: Not on file  . Highest education level: Not on file  Social Needs  . Financial resource strain: Not on file  . Food insecurity - worry: Not on file  . Food insecurity - inability: Not on file  . Transportation needs - medical: Not on file  . Transportation needs - non-medical: Not on file  Occupational History  . Not on file  Tobacco Use  . Smoking status: Never Smoker  . Smokeless tobacco: Never Used  Substance and Sexual Activity  . Alcohol use: No  . Drug use: No  . Sexual activity: Not on file  Other Topics Concern  . Not on file  Social History Narrative  . Not on file    Allergies: Allergies  Allergen Reactions  . Percocet [Oxycodone-Acetaminophen] Nausea And Vomiting    Current Medications: Current Outpatient Medications  Medication Sig Dispense Refill  . HYDROcodone-acetaminophen (NORCO/VICODIN) 5-325 MG tablet Take 1-2 tablets by mouth every 4 (four) hours as needed for moderate pain. 45 tablet 0  . ondansetron (ZOFRAN) 4 MG tablet Take 1 tablet (4 mg total) by mouth every 6 (six) hours as needed for nausea. 56 tablet 1  . valACYclovir (VALTREX) 1000 MG tablet Take 1 tablet (1,000 mg total) by mouth 3 (three) times daily. 30 tablet 0  . ibuprofen (ADVIL,MOTRIN) 200 MG tablet Take 200-400 mg by mouth daily as needed for headache.     No current facility-administered medications for this visit.     Review of Systems General: no complaints  HEENT: no complaints  Lungs: no complaints  Cardiac: no complaints  GI: no complaints  GU: no complaints  Musculoskeletal: no complaints  Extremities: no complaints  Skin: no complaints  Neuro: no complaints  Endocrine: no complaints  Psych: no complaints      Objective:  Physical Examination:  BP (!) 156/89 (BP Location: Right Arm, Patient Position: Sitting)   Pulse (!) 58   Temp 98.6 F (37 C) (Tympanic)   Resp (!) 22   Ht 5\' 3"  (1.6 m)   Wt 137 lb 6.4 oz (62.3 kg)   LMP  03/01/2017   SpO2 99%   BMI 24.34 kg/m    ECOG Performance Status: 1 - Symptomatic but completely ambulatory  General appearance: alert, cooperative and appears stated age HEENT: ATNC Lymph node survey: non-palpable, axillary, inguinal, supraclavicular Cardiovascular: RRR Respiratory: B CTA. Abdomen: SNDNT. Incision transverse well healed. No hernias, no masses, no ascites.  Extremities:No edema Skin exam - Well healed incisions.  Neurological exam reveals alert, oriented, normal speech, no focal findings or movement disorder noted  Pelvic: Vulva: normal appearing vulva with no masses, tenderness or lesions; Vagina: normal; Adnexa: bilateral enlarged masses 6-8 cm attached to the uterus. The masses are mobile, but mobility is limited. Uterus - unable to determine exact size due to masses, in total with masses the reproductive organs are ~12-14 week size. Cervix: normal; parametria smooth. Rectal: confirms with mass effect; no obvious mucosal involvement.  Lab Review As per HPI Lab Results  Component Value Date   WBC 12.3 (H) 03/08/2017   HGB 12.0 03/08/2017   HCT 35.5 03/08/2017   MCV 86.3 03/08/2017   PLT 132 (L) 03/08/2017     Chemistry      Component Value Date/Time   NA 139 03/09/2017 0408   NA 141 07/27/2014 1452   K 3.2 (L) 03/09/2017 0408   CL 107 03/09/2017 0408   CO2 23 03/09/2017 0408   BUN 11 03/09/2017 0408   BUN 8 07/27/2014 1452   CREATININE 0.95 03/09/2017 0408      Component Value Date/Time   CALCIUM 8.6 (L) 03/09/2017 0408   ALKPHOS 68 03/07/2017 0921   AST 34 03/07/2017 0921   ALT 15 03/07/2017 0921   BILITOT 1.4 (H) 03/07/2017 0921   BILITOT 0.6 07/27/2014 1452       Radiologic Imaging: As per HPI    Assessment:  Julie Warren is a 52 y.o. female diagnosed with symptomatic bilateral pelvic masses, elevated inhibin B and left ureteral obstruction s/p stent. Differential diagnosis included benign disease, endometriosis, malignancies such as  granulosa cell tumor, stromal tumors, or EOC.   Medical co-morbidities complicating care: prior abdominal surgery. Plan:   Problem List Items Addressed This Visit      Other   Pelvic mass - Primary      We discussed options for management and I agree with surgery. I explained that we do not know what we are going to find at the time of surgery which may influence surgical procedures performed, complexity, incision, and need for additional surgery. Her insurance does not cover [facility] and we are not available for her surgery at [facility] for several weeks. She would like the procedure performed as soon as possible. She is also concerned about undergoing more than one procedure. I explained to her that my exam and the imaging suggests that the procedure may be challenging with potential involvement of the ureter and bowel, but the only way to verify this concern is with surgical evaluation.  I have recommended referral to [facility] in [facility] for evaluation and surgery. I contacted Dr. Alycia Rossetti and her team. The patient has an appointment with Dr. Skeet Latch this Friday and Dr. Alycia Rossetti will check on her OR schedule for next week.   The patient's diagnosis, an outline of the further diagnostic and laboratory studies which will be required, the recommendation, and alternatives were discussed.  All questions were answered to the patient's satisfaction.   Gillis Ends, MD    CC:  Referring Provider: Dr. Laverta Baltimore Dr. Vikki Ports Ward

## 2017-03-13 NOTE — Telephone Encounter (Signed)
Flagged on EMMI report for having questions about discharge papers, not being sure who to call about changes in condition, not knowing if new RXs given and having other questions or problems.  Called and spoke with patient.  She states she will be back on 3/8 for surgery as it couldn't be performed when she was an inpatient.   She's been in contact with Dr. Guido Sander office and has an appointment with Dr. Theora Gianotti on 3/6 in the [facility].   Patient reports she did pick up her medications from discharge and has been taking them about every 8 hours. Expressed she will go over her questions with Dr. Theora Gianotti during her appointment.

## 2017-03-14 ENCOUNTER — Inpatient Hospital Stay: Payer: BLUE CROSS/BLUE SHIELD | Attending: Obstetrics and Gynecology | Admitting: Obstetrics and Gynecology

## 2017-03-14 ENCOUNTER — Encounter: Admission: RE | Admit: 2017-03-14 | Payer: BLUE CROSS/BLUE SHIELD | Source: Ambulatory Visit

## 2017-03-14 VITALS — BP 156/89 | HR 58 | Temp 98.6°F | Resp 22 | Ht 63.0 in | Wt 137.4 lb

## 2017-03-14 DIAGNOSIS — Z87898 Personal history of other specified conditions: Secondary | ICD-10-CM | POA: Insufficient documentation

## 2017-03-14 DIAGNOSIS — R1900 Intra-abdominal and pelvic swelling, mass and lump, unspecified site: Secondary | ICD-10-CM | POA: Diagnosis present

## 2017-03-14 NOTE — Patient Instructions (Signed)
We have made you an appointment with Dr. Janie Morning at [facility] on Friday, at 1:45. They ask that you arrive by 1:15. Please call the clinic for further details including address of clinic. [phone].  We have spoken to Dr. Leonides Schanz who will cancel your surgery at [facility] on Friday.

## 2017-03-14 NOTE — Progress Notes (Signed)
Refused counseling service.

## 2017-03-16 ENCOUNTER — Inpatient Hospital Stay
Admission: RE | Admit: 2017-03-16 | Payer: BLUE CROSS/BLUE SHIELD | Source: Ambulatory Visit | Admitting: Obstetrics & Gynecology

## 2017-03-16 ENCOUNTER — Encounter: Admission: RE | Payer: Self-pay | Source: Ambulatory Visit

## 2017-03-16 SURGERY — ROBOTIC ASSISTED TOTAL HYSTERECTOMY WITH BILATERAL SALPINGO OOPHORECTOMY
Anesthesia: Choice

## 2017-03-27 HISTORY — PX: ABDOMINAL HYSTERECTOMY: SHX81

## 2017-04-06 ENCOUNTER — Ambulatory Visit (INDEPENDENT_AMBULATORY_CARE_PROVIDER_SITE_OTHER): Payer: BLUE CROSS/BLUE SHIELD | Admitting: Urology

## 2017-04-06 ENCOUNTER — Other Ambulatory Visit: Payer: Self-pay

## 2017-04-06 ENCOUNTER — Other Ambulatory Visit: Payer: Self-pay | Admitting: Radiology

## 2017-04-06 ENCOUNTER — Encounter: Payer: Self-pay | Admitting: Urology

## 2017-04-06 VITALS — BP 125/86 | HR 56 | Ht 63.0 in | Wt 133.0 lb

## 2017-04-06 DIAGNOSIS — N1330 Unspecified hydronephrosis: Secondary | ICD-10-CM

## 2017-04-06 LAB — URINALYSIS, COMPLETE
BILIRUBIN UA: NEGATIVE
GLUCOSE, UA: NEGATIVE
KETONES UA: NEGATIVE
NITRITE UA: NEGATIVE
SPEC GRAV UA: 1.025 (ref 1.005–1.030)
UUROB: 0.2 mg/dL (ref 0.2–1.0)
pH, UA: 5.5 (ref 5.0–7.5)

## 2017-04-06 LAB — MICROSCOPIC EXAMINATION

## 2017-04-06 NOTE — H&P (View-Only) (Signed)
04/06/2017 1:39 PM   Julie Warren 08-04-1965 [id number]  Referring provider: Arnetha Courser, MD [address] [place], [place] [address]  Chief Complaint  Patient presents with  . Other    HPI: The patient is a 52 year old female who presents today to discuss her left hydronephrosis.  She was noted to have sudden onset left flank pain due to bilateral ovarian masses causing obstruction of her left ureter.  She underwent left ureteral stent placement on March 08, 2017.  Since that time, she has been to [facility] for resection of this mass via hysterectomy, BSO, and debulking of the mass.  She presents today to discuss left ureteral stent removal now that her pelvic mass is been addressed.  In discussing with the patient and reviewing the operative note, the mass turned out to be benign.  It also had no involvement of the ureter.  The surgeon was unconvinced during the dissection  that this would cause any blockage of the ureter based on where it was located in the pelvis.   PMH: Past Medical History:  Diagnosis Date  . Disorder of urinary stent insertion ([place]) 03/2017  . IFG (impaired fasting glucose)   . Vitamin D deficiency disease     Surgical History: Past Surgical History:  Procedure Laterality Date  . ABDOMINAL HYSTERECTOMY  03/27/2017  . CESAREAN SECTION    . CYSTOSCOPY W/ RETROGRADES Left 03/08/2017   Procedure: CYSTOSCOPY WITH RETROGRADE PYELOGRAM;  Surgeon: Nickie Retort, MD;  Location: [facility] ORS;  Service: Urology;  Laterality: Left;  . CYSTOSCOPY WITH STENT PLACEMENT Left 03/08/2017   Procedure: CYSTOSCOPY WITH STENT PLACEMENT;  Surgeon: Nickie Retort, MD;  Location: [facility] ORS;  Service: Urology;  Laterality: Left;    Home Medications:  Allergies as of 04/06/2017      Reactions   Percocet [oxycodone-acetaminophen] Nausea And Vomiting      Medication List        Accurate as of 04/06/17  1:39 PM. Always use your most recent med list.          HYDROcodone-acetaminophen 5-325 MG tablet Commonly known as:  NORCO/VICODIN Take 1-2 tablets by mouth every 4 (four) hours as needed for moderate pain.   ibuprofen 200 MG tablet Commonly known as:  ADVIL,MOTRIN Take 200-400 mg by mouth daily as needed for headache.   ondansetron 4 MG tablet Commonly known as:  ZOFRAN Take 1 tablet (4 mg total) by mouth every 6 (six) hours as needed for nausea.   valACYclovir 1000 MG tablet Commonly known as:  VALTREX Take 1 tablet (1,000 mg total) by mouth 3 (three) times daily.       Allergies:  Allergies  Allergen Reactions  . Percocet [Oxycodone-Acetaminophen] Nausea And Vomiting    Family History: Family History  Problem Relation Age of Onset  . Cancer Mother        lung  . COPD Mother   . Diabetes Father   . Heart disease Father   . Hyperlipidemia Father   . Hypertension Father   . COPD Maternal Aunt     Social History:  reports that she has never smoked. She has never used smokeless tobacco. She reports that she does not drink alcohol or use drugs.  ROS: UROLOGY Frequent Urination?: No Hard to postpone urination?: No Burning/pain with urination?: No Get up at night to urinate?: No Leakage of urine?: No Urine stream starts and stops?: No Trouble starting stream?: No Do you have to strain to urinate?:  No Blood in urine?: No Urinary tract infection?: No Sexually transmitted disease?: No Injury to kidneys or bladder?: No Painful intercourse?: No Weak stream?: No Currently pregnant?: No Vaginal bleeding?: No Last menstrual period?: n  Gastrointestinal Nausea?: No Vomiting?: No Indigestion/heartburn?: No Diarrhea?: No Constipation?: No  Constitutional Fever: No Night sweats?: No Weight loss?: No Fatigue?: No  Skin Skin rash/lesions?: No Itching?: No  Eyes Blurred vision?: No Double vision?: No  Ears/Nose/Throat Sore throat?: No Sinus problems?: No  Hematologic/Lymphatic Swollen glands?:  No Easy bruising?: No  Cardiovascular Leg swelling?: No Chest pain?: No  Respiratory Cough?: No Shortness of breath?: No  Endocrine Excessive thirst?: No  Musculoskeletal Back pain?: No Joint pain?: No  Neurological Headaches?: No Dizziness?: No  Psychologic Depression?: No Anxiety?: No  Physical Exam: BP 125/86   Pulse (!) 56   Ht 5\' 3"  (1.6 m)   Wt 133 lb (60.3 kg)   BMI 23.56 kg/m   Constitutional:  Alert and oriented, No acute distress. HEENT: [place] AT, moist mucus membranes.  Trachea midline, no masses. Cardiovascular: No clubbing, cyanosis, or edema. Respiratory: Normal respiratory effort, no increased work of breathing. GI: Abdomen is soft, nontender, nondistended, no abdominal masses GU: No CVA tenderness.  Skin: No rashes, bruises or suspicious lesions. Lymph: No cervical or inguinal adenopathy. Neurologic: Grossly intact, no focal deficits, moving all 4 extremities. Psychiatric: Normal mood and affect.  Laboratory Data: Lab Results  Component Value Date   WBC 12.3 (H) 03/08/2017   HGB 12.0 03/08/2017   HCT 35.5 03/08/2017   MCV 86.3 03/08/2017   PLT 132 (L) 03/08/2017    Lab Results  Component Value Date   CREATININE 0.95 03/09/2017    No results found for: PSA  No results found for: TESTOSTERONE  Lab Results  Component Value Date   HGBA1C 5.6 03/07/2017    Urinalysis    Component Value Date/Time   COLORURINE YELLOW (A) 03/07/2017 0921   APPEARANCEUR CLEAR (A) 03/07/2017 0921   LABSPEC 1.041 (H) 03/07/2017 0921   PHURINE 5.0 03/07/2017 0921   GLUCOSEU NEGATIVE 03/07/2017 0921   HGBUR MODERATE (A) 03/07/2017 0921   BILIRUBINUR NEGATIVE 03/07/2017 0921   KETONESUR 20 (A) 03/07/2017 0921   PROTEINUR NEGATIVE 03/07/2017 0921   NITRITE NEGATIVE 03/07/2017 0921   LEUKOCYTESUR NEGATIVE 03/07/2017 0921    Assessment & Plan:    1.  Left hydronephrosis The patient is now status post removal of her ovarian masses.  It was presumed  that these were the source of her left ureteral obstruction.  However the sudden onset of her pain as well as the surgeon who removed the mass is not being convinced that they were in a position to cause an obstruction, I think it be wise to remove her stent in the operating room and perform a left retrograde pyelogram at the same time to ensure that her hydronephrosis has resolved since the ovarian masses have been removed.  We will schedule her for cystoscopy, left ureteral stent removal, and left retrograde pyelogram.  Assuming the left retrograde pyelogram shows good drainage, we will leave her without a stent.  We will then obtain a renal ultrasound 1 month later to ensure no residual hydronephrosis.   Nickie Retort, MD  [facility] [address] [place], [place] [address] [phone]

## 2017-04-06 NOTE — Addendum Note (Signed)
Addended by: Lestine Box on: 04/06/2017 02:54 PM   Modules accepted: Orders

## 2017-04-06 NOTE — Progress Notes (Signed)
04/06/2017 1:39 PM   AYONA YNIGUEZ 1965-08-26 [id number]  Referring provider: Arnetha Courser, MD [address] [place],  [address]  Chief Complaint  Patient presents with  . Other    HPI: The patient is a 52 year old female who presents today to discuss her left hydronephrosis.  She was noted to have sudden onset left flank pain due to bilateral ovarian masses causing obstruction of her left ureter.  She underwent left ureteral stent placement on March 08, 2017.  Since that time, she has been to [facility] for resection of this mass via hysterectomy, BSO, and debulking of the mass.  She presents today to discuss left ureteral stent removal now that her pelvic mass is been addressed.  In discussing with the patient and reviewing the operative note, the mass turned out to be benign.  It also had no involvement of the ureter.  The surgeon was unconvinced during the dissection  that this would cause any blockage of the ureter based on where it was located in the pelvis.   PMH: Past Medical History:  Diagnosis Date  . Disorder of urinary stent insertion ([place]) 03/2017  . IFG (impaired fasting glucose)   . Vitamin D deficiency disease     Surgical History: Past Surgical History:  Procedure Laterality Date  . ABDOMINAL HYSTERECTOMY  03/27/2017  . CESAREAN SECTION    . CYSTOSCOPY W/ RETROGRADES Left 03/08/2017   Procedure: CYSTOSCOPY WITH RETROGRADE PYELOGRAM;  Surgeon: Nickie Retort, MD;  Location: [facility] ORS;  Service: Urology;  Laterality: Left;  . CYSTOSCOPY WITH STENT PLACEMENT Left 03/08/2017   Procedure: CYSTOSCOPY WITH STENT PLACEMENT;  Surgeon: Nickie Retort, MD;  Location: [facility] OR[facility];  Service: Urology;  Laterality: Left;    Home Medications:  Allergies as of 04/06/2017      Reactions   Percocet [oxycodone-acetaminophen] Nausea And Vomiting      Medication List        Accurate as of 04/06/17  1:39 PM. Always use your most recent med list.          HYDROcodone-acetaminophen 5-325 MG tablet Commonly known as:  NORCO/VICODIN Take 1-2 tablets by mouth every 4 (four) hours as needed for moderate pain.   ibuprofen 200 MG tablet Commonly known as:  ADVIL,MOTRIN Take 200-400 mg by mouth daily as needed for headache.   ondansetron 4 MG tablet Commonly known as:  ZOFRAN Take 1 tablet (4 mg total) by mouth every 6 (six) hours as needed for nausea.   valACYclovir 1000 MG tablet Commonly known as:  VALTREX Take 1 tablet (1,000 mg total) by mouth 3 (three) times daily.       Allergies:  Allergies  Allergen Reactions  . Percocet [Oxycodone-Acetaminophen] Nausea And Vomiting    Family History: Family History  Problem Relation Age of Onset  . Cancer Mother        lung  . COPD Mother   . Diabetes Father   . Heart disease Father   . Hyperlipidemia Father   . Hypertension Father   . COPD Maternal Aunt     Social History:  reports that she has never smoked. She has never used smokeless tobacco. She reports that she does not drink alcohol or use drugs.  ROS: UROLOGY Frequent Urination?: No Hard to postpone urination?: No Burning/pain with urination?: No Get up at night to urinate?: No Leakage of urine?: No Urine stream starts and stops?: No Trouble starting stream?: No Do you have to strain to urinate?:  No Blood in urine?: No Urinary tract infection?: No Sexually transmitted disease?: No Injury to kidneys or bladder?: No Painful intercourse?: No Weak stream?: No Currently pregnant?: No Vaginal bleeding?: No Last menstrual period?: n  Gastrointestinal Nausea?: No Vomiting?: No Indigestion/heartburn?: No Diarrhea?: No Constipation?: No  Constitutional Fever: No Night sweats?: No Weight loss?: No Fatigue?: No  Skin Skin rash/lesions?: No Itching?: No  Eyes Blurred vision?: No Double vision?: No  Ears/Nose/Throat Sore throat?: No Sinus problems?: No  Hematologic/Lymphatic Swollen glands?:  No Easy bruising?: No  Cardiovascular Leg swelling?: No Chest pain?: No  Respiratory Cough?: No Shortness of breath?: No  Endocrine Excessive thirst?: No  Musculoskeletal Back pain?: No Joint pain?: No  Neurological Headaches?: No Dizziness?: No  Psychologic Depression?: No Anxiety?: No  Physical Exam: BP 125/86   Pulse (!) 56   Ht 5\' 3"  (1.6 m)   Wt 133 lb (60.3 kg)   BMI 23.56 kg/m   Constitutional:  Alert and oriented, No acute distress. HEENT: [place] AT, moist mucus membranes.  Trachea midline, no masses. Cardiovascular: No clubbing, cyanosis, or edema. Respiratory: Normal respiratory effort, no increased work of breathing. GI: Abdomen is soft, nontender, nondistended, no abdominal masses GU: No CVA tenderness.  Skin: No rashes, bruises or suspicious lesions. Lymph: No cervical or inguinal adenopathy. Neurologic: Grossly intact, no focal deficits, moving all 4 extremities. Psychiatric: Normal mood and affect.  Laboratory Data: Lab Results  Component Value Date   WBC 12.3 (H) 03/08/2017   HGB 12.0 03/08/2017   HCT 35.5 03/08/2017   MCV 86.3 03/08/2017   PLT 132 (L) 03/08/2017    Lab Results  Component Value Date   CREATININE 0.95 03/09/2017    No results found for: PSA  No results found for: TESTOSTERONE  Lab Results  Component Value Date   HGBA1C 5.6 03/07/2017    Urinalysis    Component Value Date/Time   COLORURINE YELLOW (A) 03/07/2017 0921   APPEARANCEUR CLEAR (A) 03/07/2017 0921   LABSPEC 1.041 (H) 03/07/2017 0921   PHURINE 5.0 03/07/2017 0921   GLUCOSEU NEGATIVE 03/07/2017 0921   HGBUR MODERATE (A) 03/07/2017 0921   BILIRUBINUR NEGATIVE 03/07/2017 0921   KETONESUR 20 (A) 03/07/2017 0921   PROTEINUR NEGATIVE 03/07/2017 0921   NITRITE NEGATIVE 03/07/2017 0921   LEUKOCYTESUR NEGATIVE 03/07/2017 0921    Assessment & Plan:    1.  Left hydronephrosis The patient is now status post removal of her ovarian masses.  It was presumed  that these were the source of her left ureteral obstruction.  However the sudden onset of her pain as well as the surgeon who removed the mass is not being convinced that they were in a position to cause an obstruction, I think it be wise to remove her stent in the operating room and perform a left retrograde pyelogram at the same time to ensure that her hydronephrosis has resolved since the ovarian masses have been removed.  We will schedule her for cystoscopy, left ureteral stent removal, and left retrograde pyelogram.  Assuming the left retrograde pyelogram shows good drainage, we will leave her without a stent.  We will then obtain a renal ultrasound 1 month later to ensure no residual hydronephrosis.   Nickie Retort, MD  [facility] [address], [place], [place] [address] [phone]

## 2017-04-09 LAB — CULTURE, URINE COMPREHENSIVE

## 2017-04-10 MED ORDER — CEFAZOLIN SODIUM-DEXTROSE 2-4 GM/100ML-% IV SOLN: 2.0000 g | INTRAVENOUS | Status: DC

## 2017-04-10 MED ORDER — CEFAZOLIN SODIUM-DEXTROSE 2-4 GM/100ML-% IV SOLN
2.0000 g | INTRAVENOUS | Status: AC
  Administered 2017-04-11: 2 g via INTRAVENOUS

## 2017-04-11 ENCOUNTER — Encounter
Admission: RE | Disposition: A | Discharge status: Home / Self Care | Payer: Self-pay | Source: Ambulatory Visit | Attending: Urology

## 2017-04-11 ENCOUNTER — Other Ambulatory Visit: Payer: Self-pay

## 2017-04-11 ENCOUNTER — Ambulatory Visit
Admission: RE | Admit: 2017-04-11 | Discharge: 2017-04-11 | Disposition: A | Discharge status: Home / Self Care | Payer: BLUE CROSS/BLUE SHIELD | Source: Ambulatory Visit | Attending: Urology | Admitting: Urology

## 2017-04-11 ENCOUNTER — Telehealth: Payer: Self-pay | Admitting: Urology

## 2017-04-11 ENCOUNTER — Ambulatory Visit: Payer: BLUE CROSS/BLUE SHIELD | Admitting: Certified Registered"

## 2017-04-11 ENCOUNTER — Encounter: Payer: Self-pay | Admitting: Certified Registered"

## 2017-04-11 DIAGNOSIS — Z885 Allergy status to narcotic agent status: Secondary | ICD-10-CM | POA: Insufficient documentation

## 2017-04-11 DIAGNOSIS — N1339 Other hydronephrosis: Secondary | ICD-10-CM | POA: Insufficient documentation

## 2017-04-11 DIAGNOSIS — Z90710 Acquired absence of both cervix and uterus: Secondary | ICD-10-CM | POA: Insufficient documentation

## 2017-04-11 DIAGNOSIS — N1330 Unspecified hydronephrosis: Secondary | ICD-10-CM

## 2017-04-11 DIAGNOSIS — R1900 Intra-abdominal and pelvic swelling, mass and lump, unspecified site: Secondary | ICD-10-CM | POA: Diagnosis not present

## 2017-04-11 HISTORY — PX: CYSTOSCOPY W/ URETERAL STENT REMOVAL: SHX1430

## 2017-04-11 HISTORY — PX: CYSTOSCOPY W/ RETROGRADES: SHX1426

## 2017-04-11 LAB — POCT PREGNANCY, URINE: PREG TEST UR: NEGATIVE

## 2017-04-11 SURGERY — REMOVAL, STENT, URETER, CYSTOSCOPIC
Anesthesia: General | Laterality: Left | Wound class: Clean Contaminated

## 2017-04-11 MED ORDER — HYDROMORPHONE HCL 1 MG/ML IJ SOLN
INTRAMUSCULAR | Status: AC
  Filled 2017-04-11: qty 1

## 2017-04-11 MED ORDER — MIDAZOLAM HCL 2 MG/2ML IJ SOLN
INTRAMUSCULAR | Status: DC | PRN
  Administered 2017-04-11: 4 mg via INTRAVENOUS

## 2017-04-11 MED ORDER — HYDROCODONE-ACETAMINOPHEN 7.5-325 MG PO TABS: 1.0000 | ORAL_TABLET | Freq: Once | ORAL | Status: DC | PRN

## 2017-04-11 MED ORDER — SEVOFLURANE IN SOLN
RESPIRATORY_TRACT | Status: AC
  Filled 2017-04-11: qty 250

## 2017-04-11 MED ORDER — ACETAMINOPHEN 10 MG/ML IV SOLN
INTRAVENOUS | Status: DC | PRN
  Administered 2017-04-11: 1000 mg via INTRAVENOUS

## 2017-04-11 MED ORDER — GLYCOPYRROLATE 0.2 MG/ML IJ SOLN
INTRAMUSCULAR | Status: DC | PRN
  Administered 2017-04-11 (×2): 0.1 mg via INTRAVENOUS

## 2017-04-11 MED ORDER — LIDOCAINE HCL (CARDIAC) 20 MG/ML IV SOLN
INTRAVENOUS | Status: DC | PRN
  Administered 2017-04-11: 50 mg via INTRAVENOUS

## 2017-04-11 MED ORDER — HYDROMORPHONE HCL 1 MG/ML IJ SOLN: 0.2500 mg | INTRAMUSCULAR | Status: DC | PRN

## 2017-04-11 MED ORDER — GLYCOPYRROLATE 0.2 MG/ML IJ SOLN
INTRAMUSCULAR | Status: AC
  Filled 2017-04-11: qty 1

## 2017-04-11 MED ORDER — MIDAZOLAM HCL 2 MG/2ML IJ SOLN
INTRAMUSCULAR | Status: AC
  Filled 2017-04-11: qty 4

## 2017-04-11 MED ORDER — PROPOFOL 10 MG/ML IV BOLUS
INTRAVENOUS | Status: DC | PRN
  Administered 2017-04-11: 150 mg via INTRAVENOUS

## 2017-04-11 MED ORDER — PHENYLEPHRINE HCL 10 MG/ML IJ SOLN
INTRAMUSCULAR | Status: AC
  Filled 2017-04-11: qty 1

## 2017-04-11 MED ORDER — HYDROMORPHONE HCL 1 MG/ML IJ SOLN
INTRAMUSCULAR | Status: DC | PRN
  Administered 2017-04-11: 1 mg via INTRAVENOUS

## 2017-04-11 MED ORDER — LIDOCAINE HCL (PF) 2 % IJ SOLN
INTRAMUSCULAR | Status: AC
  Filled 2017-04-11: qty 10

## 2017-04-11 MED ORDER — PHENYLEPHRINE HCL 10 MG/ML IJ SOLN
INTRAMUSCULAR | Status: DC | PRN
  Administered 2017-04-11 (×4): 100 ug via INTRAVENOUS

## 2017-04-11 MED ORDER — MEPERIDINE HCL 50 MG/ML IJ SOLN: 6.2500 mg | INTRAMUSCULAR | Status: DC | PRN

## 2017-04-11 MED ORDER — DEXAMETHASONE SODIUM PHOSPHATE 10 MG/ML IJ SOLN
INTRAMUSCULAR | Status: AC
  Filled 2017-04-11: qty 1

## 2017-04-11 MED ORDER — PROMETHAZINE HCL 25 MG/ML IJ SOLN: 6.2500 mg | INTRAMUSCULAR | Status: DC | PRN

## 2017-04-11 MED ORDER — PROPOFOL 10 MG/ML IV BOLUS
INTRAVENOUS | Status: AC
  Filled 2017-04-11: qty 20

## 2017-04-11 MED ORDER — ONDANSETRON HCL 4 MG/2ML IJ SOLN
INTRAMUSCULAR | Status: AC
  Filled 2017-04-11: qty 4

## 2017-04-11 MED ORDER — DEXAMETHASONE SODIUM PHOSPHATE 10 MG/ML IJ SOLN
INTRAMUSCULAR | Status: DC | PRN
  Administered 2017-04-11: 10 mg via INTRAVENOUS

## 2017-04-11 MED ORDER — LACTATED RINGERS IV SOLN
INTRAVENOUS | Status: DC
  Administered 2017-04-11 (×2): via INTRAVENOUS

## 2017-04-11 MED ORDER — ACETAMINOPHEN 10 MG/ML IV SOLN
INTRAVENOUS | Status: AC
  Filled 2017-04-11: qty 100

## 2017-04-11 MED ORDER — CEFAZOLIN SODIUM-DEXTROSE 2-4 GM/100ML-% IV SOLN
INTRAVENOUS | Status: AC
  Filled 2017-04-11: qty 100

## 2017-04-11 MED ORDER — ONDANSETRON HCL 4 MG/2ML IJ SOLN
INTRAMUSCULAR | Status: DC | PRN
  Administered 2017-04-11: 4 mg via INTRAVENOUS

## 2017-04-11 SURGICAL SUPPLY — 20 items
BAG DRAIN CYSTO-URO LG1000N (MISCELLANEOUS) ×3 IMPLANT
BRUSH SCRUB EZ  4% CHG (MISCELLANEOUS) ×2
BRUSH SCRUB EZ 4% CHG (MISCELLANEOUS) ×1 IMPLANT
CATH URETL 5X70 OPEN END (CATHETERS) ×3 IMPLANT
CONRAY 43 FOR UROLOGY 50M (MISCELLANEOUS) ×3 IMPLANT
DRAPE UTILITY 15X26 TOWEL STRL (DRAPES) ×3 IMPLANT
GLOVE BIO SURGEON STRL SZ 6.5 (GLOVE) ×4 IMPLANT
GLOVE BIO SURGEONS STRL SZ 6.5 (GLOVE) ×2
GOWN STRL REUS W/ TWL LRG LVL3 (GOWN DISPOSABLE) ×2 IMPLANT
GOWN STRL REUS W/TWL LRG LVL3 (GOWN DISPOSABLE) ×4
KIT TURNOVER CYSTO (KITS) ×3 IMPLANT
PACK CYSTO AR (MISCELLANEOUS) ×3 IMPLANT
SENSORWIRE 0.038 NOT ANGLED (WIRE) ×3
SET CYSTO W/LG BORE CLAMP LF (SET/KITS/TRAYS/PACK) ×3 IMPLANT
SOL .9 NS 3000ML IRR  AL (IV SOLUTION) ×2
SOL .9 NS 3000ML IRR UROMATIC (IV SOLUTION) ×1 IMPLANT
STENT URET 6FRX24 CONTOUR (STENTS) ×3 IMPLANT
SURGILUBE 2OZ TUBE FLIPTOP (MISCELLANEOUS) ×3 IMPLANT
WATER STERILE IRR 1000ML POUR (IV SOLUTION) ×3 IMPLANT
WIRE SENSOR 0.038 NOT ANGLED (WIRE) ×1 IMPLANT

## 2017-04-11 NOTE — Discharge Instructions (Signed)
You have a ureteral stent in place.  This is a tube that extends from your kidney to your bladder.  This may cause urinary bleeding, burning with urination, and urinary frequency.  Please call our office or present to the ED if you develop fevers >101 or pain which is not able to be controlled with oral pain medications.  You may be given either Flomax and/ or ditropan to help with bladder spasms and stent pain in addition to pain medications.    Your stent is on a string.  On Monday, April 16, 2017, you may untape your stent string and pull gently until entirely removed.  If you have any issues, please call our office.  If the stent is removed prematurely, please call us during business hours to let us know.    [facility] [address] [place], [place] [address] [phone]   AMBULATORY SURGERY  DISCHARGE INSTRUCTIONS   1) The drugs that you were given will stay in your system until tomorrow so for the next 24 hours you should not:  A) Drive an automobile B) Make any legal decisions C) Drink any alcoholic beverage   2) You may resume regular meals tomorrow.  Today it is better to start with liquids and gradually work up to solid foods.  You may eat anything you prefer, but it is better to start with liquids, then soup and crackers, and gradually work up to solid foods.   3) Please notify your doctor immediately if you have any unusual bleeding, trouble breathing, redness and pain at the surgery site, drainage, fever, or pain not relieved by medication.    4) Additional Instructions:        Please contact your physician with any problems or Same Day Surgery at [phone], Monday through Friday 6 am to 4 pm, or [place] at [facility] number at [phone].

## 2017-04-11 NOTE — Anesthesia Post-op Follow-up Note (Signed)
Anesthesia QCDR form completed.        

## 2017-04-11 NOTE — Interval H&P Note (Signed)
History and Physical Interval Note:  04/11/2017 1:27 PM  Julie Warren  has presented today for surgery, with the diagnosis of left hydronephrosis  The various methods of treatment have been discussed with the patient and family. After consideration of risks, benefits and other options for treatment, the patient has consented to  Procedure(s): CYSTOSCOPY WITH STENT REMOVAL (Left) CYSTOSCOPY WITH RETROGRADE PYELOGRAM (Left) as a surgical intervention .  The patient's history has been reviewed, patient examined, no change in status, stable for surgery.  I have reviewed the patient's chart and labs.  Questions were answered to the patient's satisfaction.    [facility] CTAB  Hollice Espy

## 2017-04-11 NOTE — Op Note (Signed)
Date of procedure: 04/11/17  Preoperative diagnosis:  1. History of left hydronephrosis  Postoperative diagnosis:  1. Same as above  Procedure: 1. Cystoscopy 2. Left retrograde pyelogram 3. Diagnostic left ureteroscopy 4. Left ureteral stent exchange  Surgeon: Hollice Espy, MD  Anesthesia: General  Complications: None  Intraoperative findings: Retrograde with sluggish filling of the mid ureter at the level of the pelvic brim concerning for possible intraluminal obstruction versus stricture.  Negative diagnostic ureteroscopy, ureteral edema diffusely.  Stent replaced on a tether.  EBL: Minimal  Specimens: None  Drains: 6 x 24 French double-J ureteral stent on left, string left in place to the left inner thigh  Indication: Julie Warren is a 52 y.o. patient with left pelvic mass arriving from the left ovary with presumed mass-effect on the left ureter with left flank pain obstruction.  She underwent urgent ureteral stent placement in the past and is now had her pelvic mass resected.  She returns today for further diagnostic workup.  After reviewing the management options for treatment, she elected to proceed with the above surgical procedure(s). We have discussed the potential benefits and risks of the procedure, side effects of the proposed treatment, the likelihood of the patient achieving the goals of the procedure, and any potential problems that might occur during the procedure or recuperation. Informed consent has been obtained.  Description of procedure:  The patient was taken to the operating room and general anesthesia was induced.  The patient was placed in the dorsal lithotomy position, prepped and draped in the usual sterile fashion, and preoperative antibiotics were administered. A preoperative time-out was performed.   A 52 French cystoscope was advanced per urethra into the bladder.  The bladder was carefully inspected and noted to be free of any lesions.  Attention  was turned to the left ureteral orifice from which a ureteral stent was seen emanating.  Stent graspers were used to extract the stent without difficulty.  A 5 French open-ended ureteral catheter was placed just within the mouth of the left ureteral orifice and a gentle retrograde pyelogram was performed.  This showed a decompressed upper tract collecting system but no clear filling within the mid ureter down to level of the pelvic brim where there appeared to be normal filling of a decompressed ureter distal to this.  This is suspicious for either proximal obstructing stone, edema, or an intraluminal lesion.  As such, wires placed up to level of the kidney.  4.5 French semirigid ureteroscope was able to be advanced all the way up to level where this transition was noted but not beyond.  A second Super Stiff wire was then placed up to level of the kidney.  8 French flexible ureteroscope was then advanced over the Super Stiff working wire up to level the kidney without difficulty which passed easily without resistance.  The upper tract was inspected and noted to be completely normal and decompressed.  The scope was then backed down the length of the ureter very carefully inspecting along the way.  There are no stones, ureteral obstruction, or any other lesions identified within the ureter.  At the level of the pelvic brim, there was a more pronounced area of ureteral edema as compared to the other portion of the ureter but otherwise no clear pathology or obstruction.  A second retrograde Polygram was performed at this level without contrast extravasation in the ureter filled nicely.  It was drained promptly.  As such, the scope was then ultimately removed.  Due to presence of ureteral edema and manipulation today, I did elect to replace a 6 x 24 French double-J ureteral stent by advancing along the safety wire up to level the renal pelvis.  This is partially drawn a focal was noted within the renal pelvis.  This was  then fully withdrawn and a focal is noted within the bladder.  A tether was left affixed to the distal coil of the string which was taped to the left inner thigh using Mastisol and Tegaderm.  The bladder was drained.  The patient was then clean and dry, repositioned in supine position, reversed from anesthesia, taken to the PACU in stable condition.  Plan: Patient may remove her stent on string on Monday.  She will follow-up in 4 weeks with renal ultrasound prior.  Findings were discussed with her sister-in-law who accompanied her today.  No additional scripts are given as she presumably has scripts from previous intervention.  Hollice Espy, MD   Hollice Espy, M.D.

## 2017-04-11 NOTE — Anesthesia Procedure Notes (Signed)
Procedure Name: LMA Insertion Date/Time: 04/11/2017 2:01 PM Performed by: Nile Riggs, CRNA Pre-anesthesia Checklist: Patient identified, Suction available, Emergency Drugs available, Patient being monitored and Timeout performed Patient Re-evaluated:Patient Re-evaluated prior to induction Oxygen Delivery Method: Circle system utilized Preoxygenation: Pre-oxygenation with 100% oxygen Induction Type: IV induction Ventilation: Mask ventilation without difficulty LMA: LMA inserted LMA Size: 3.5 Number of attempts: 1 Placement Confirmation: positive ETCO2,  CO2 detector and breath sounds checked- equal and bilateral Tube secured with: Tape Dental Injury: Teeth and Oropharynx as per pre-operative assessment

## 2017-04-11 NOTE — Anesthesia Preprocedure Evaluation (Signed)
Anesthesia Evaluation  Patient identified by MRN, date of birth, ID band Patient awake    Reviewed: Allergy & Precautions, H&P , NPO status , reviewed documented beta blocker date and time   Airway Mallampati: II  TM Distance: >3 FB     Dental  (+) Chipped   Pulmonary neg pulmonary ROS,    Pulmonary exam normal        Cardiovascular negative cardio ROS Normal cardiovascular exam     Neuro/Psych negative neurological ROS  negative psych ROS   GI/Hepatic negative GI ROS, Neg liver ROS, neg GERD  ,  Endo/Other    Renal/GU Renal diseaseHydronephrosis Bladder dysfunction      Musculoskeletal   Abdominal   Peds  Hematology negative hematology ROS (+)   Anesthesia Other Findings   Reproductive/Obstetrics negative OB ROS S/P Hysterectomy                             Anesthesia Physical Anesthesia Plan  ASA: II  Anesthesia Plan: General LMA   Post-op Pain Management:    Induction:   PONV Risk Score and Plan: Propofol infusion  Airway Management Planned:   Additional Equipment:   Intra-op Plan:   Post-operative Plan:   Informed Consent: I have reviewed the patients History and Physical, chart, labs and discussed the procedure including the risks, benefits and alternatives for the proposed anesthesia with the patient or authorized representative who has indicated his/her understanding and acceptance.   Dental Advisory Given  Plan Discussed with: CRNA  Anesthesia Plan Comments:         Anesthesia Quick Evaluation

## 2017-04-11 NOTE — Telephone Encounter (Signed)
App made ° ° °Michelle  °

## 2017-04-11 NOTE — Transfer of Care (Signed)
Immediate Anesthesia Transfer of Care Note  Patient: Julie Warren  Procedure(s) Performed: CYSTOSCOPY WITH STENT REMOVAL (Left ) CYSTOSCOPY WITH RETROGRADE PYELOGRAM (Left )  Patient Location: PACU  Anesthesia Type:General  Level of Consciousness: awake, alert , oriented and patient cooperative  Airway & Oxygen Therapy: Patient Spontanous Breathing and Patient connected to face mask oxygen  Post-op Assessment: Report given to RN, Post -op Vital signs reviewed and stable and Patient moving all extremities  Post vital signs: Reviewed and stable  Last Vitals:  Vitals Value Taken Time  BP    Temp    Pulse    Resp    SpO2      Last Pain:  Vitals:   04/11/17 1328  TempSrc: Oral  PainSc: 0-No pain         Complications: No apparent anesthesia complications

## 2017-04-12 ENCOUNTER — Encounter: Payer: Self-pay | Admitting: Urology

## 2017-04-12 ENCOUNTER — Telehealth: Payer: Self-pay

## 2017-04-12 MED ORDER — HEPARIN SODIUM (PORCINE) 5000 UNIT/ML IJ SOLN: 5000.0000 [IU] | INTRAMUSCULAR | Status: DC

## 2017-04-12 MED ORDER — SCOPOLAMINE 1 MG/3DAYS TD PT72: 1.0000 | MEDICATED_PATCH | TRANSDERMAL | Status: DC

## 2017-04-12 MED ORDER — GABAPENTIN 300 MG PO CAPS: 600.0000 mg | ORAL_CAPSULE | ORAL | Status: DC

## 2017-04-12 MED ORDER — CELECOXIB 200 MG PO CAPS: 400.0000 mg | ORAL_CAPSULE | ORAL | Status: DC

## 2017-04-12 MED ORDER — ACETAMINOPHEN 500 MG PO TABS: 1000.0000 mg | ORAL_TABLET | ORAL | Status: DC

## 2017-04-12 MED ORDER — DEXAMETHASONE SODIUM PHOSPHATE 4 MG/ML IJ SOLN: 4.0000 mg | INTRAMUSCULAR | Status: DC

## 2017-04-12 NOTE — Anesthesia Postprocedure Evaluation (Signed)
Anesthesia Post Note  Patient: Julie Warren  Procedure(s) Performed: CYSTOSCOPY WITH STENT REMOVAL (Left ) CYSTOSCOPY WITH RETROGRADE PYELOGRAM (Left )  Patient location during evaluation: PACU Anesthesia Type: General Level of consciousness: awake and alert Pain management: pain level controlled Vital Signs Assessment: post-procedure vital signs reviewed and stable Respiratory status: spontaneous breathing, nonlabored ventilation and respiratory function stable Cardiovascular status: blood pressure returned to baseline and stable Postop Assessment: no apparent nausea or vomiting Anesthetic complications: no     Last Vitals:  Vitals:   04/11/17 1545 04/11/17 1555  BP:  112/81  Pulse: 74 82  Resp: (!) 23 18  Temp:  36.5 C  SpO2: 96% 100%    Last Pain:  Vitals:   04/11/17 1555  TempSrc: Oral  PainSc: 0-No pain                 Alphonsus Sias

## 2017-04-12 NOTE — Telephone Encounter (Signed)
She can have #6 viocodin but prefer that she uses ibuprofen/Tylenol prior to narcotics.  Hollice Espy, MD

## 2017-04-12 NOTE — Telephone Encounter (Signed)
Pt called stating she only has 1 hydrocodone left from hysterectomy and was not given another script for pain meds yesterday. Pt is requesting more pain medications. Please advise.

## 2017-04-13 NOTE — Telephone Encounter (Signed)
No advise.  This was not ordered by me and I didn't know that it was an issue.  Also think its really silly and inappropriate.   Hollice Espy, MD

## 2017-04-13 NOTE — Telephone Encounter (Signed)
Spoke with pt in reference to viocodin. Pt stated that she is feeling better and did not want the pain medication. Pt then inquired about why a pregnancy test was performed prior to surgery. Pt stated that 2 weeks prior to surgery she had a hysterectomy. Pt stated "That's money wasted now especially since I had the hysterectomy". Please advise.

## 2017-04-16 NOTE — Telephone Encounter (Signed)
Spoke w/ pt, she is going to contact billing at [facility].

## 2017-05-11 ENCOUNTER — Ambulatory Visit
Admission: RE | Admit: 2017-05-11 | Discharge: 2017-05-11 | Disposition: A | Discharge status: Home / Self Care | Payer: BLUE CROSS/BLUE SHIELD | Source: Ambulatory Visit | Attending: Urology | Admitting: Urology

## 2017-05-11 DIAGNOSIS — N1330 Unspecified hydronephrosis: Secondary | ICD-10-CM | POA: Diagnosis present

## 2017-05-18 ENCOUNTER — Ambulatory Visit (INDEPENDENT_AMBULATORY_CARE_PROVIDER_SITE_OTHER): Payer: BLUE CROSS/BLUE SHIELD | Admitting: Urology

## 2017-05-18 ENCOUNTER — Encounter: Payer: Self-pay | Admitting: Urology

## 2017-05-18 VITALS — BP 119/78 | HR 48 | Ht 63.0 in | Wt 144.0 lb

## 2017-05-18 DIAGNOSIS — N1330 Unspecified hydronephrosis: Secondary | ICD-10-CM

## 2017-05-18 NOTE — Progress Notes (Signed)
05/18/2017 9:23 AM   Julie Warren 1965/08/17 [id number]  Referring provider: Arnetha Courser, MD [address] [place], [place] [address]  Chief Complaint  Patient presents with  . Results    1 month    HPI: 52 year old female who presents today for follow-up of left-sided hydronephrosis.  She initially presented with acute onset left flank pain found to have left hydronephrosis as well as a large left-sided pelvic mass.  Ureteral stent was placed at the time she ultimately underwent hysterectomy, BSO and debulking of the mass was ultimately benign.  The ureter appeared to be uninvolved.  In the setting of hydronephrosis of unclear etiology, she returned to the operating room on 04/11/2017 for diagnostic ureteroscopy which showed only ureteral edema, no stones or masses.  Stent was left on a tether which was ultimately removed several days following the procedure.  Follow-up renal ultrasound shows no residual hydronephrosis is otherwise unremarkable.  She is feeling very well.  She denies any flank pain or urinary symptoms.  She is been cleared to get back into the gym and is excited about doing so.   PMH: Past Medical History:  Diagnosis Date  . Disorder of urinary stent insertion ([place]) 03/2017  . IFG (impaired fasting glucose)   . Vitamin D deficiency disease     Surgical History: Past Surgical History:  Procedure Laterality Date  . ABDOMINAL HYSTERECTOMY  03/27/2017  . CESAREAN SECTION    . CYSTOSCOPY W/ RETROGRADES Left 03/08/2017   Procedure: CYSTOSCOPY WITH RETROGRADE PYELOGRAM;  Surgeon: Nickie Retort, MD;  Location: [facility] ORS;  Service: Urology;  Laterality: Left;  . CYSTOSCOPY W/ RETROGRADES Left 04/11/2017   Procedure: CYSTOSCOPY WITH RETROGRADE PYELOGRAM;  Surgeon: Hollice Espy, MD;  Location: [facility];  Service: Urology;  Laterality: Left;  . CYSTOSCOPY W/ URETERAL STENT REMOVAL Left 04/11/2017   Procedure: CYSTOSCOPY WITH STENT REMOVAL;   Surgeon: Hollice Espy, MD;  Location: [facility];  Service: Urology;  Laterality: Left;  . CYSTOSCOPY WITH STENT PLACEMENT Left 03/08/2017   Procedure: CYSTOSCOPY WITH STENT PLACEMENT;  Surgeon: Nickie Retort, MD;  Location: [facility] OR[facility];  Service: Urology;  Laterality: Left;    Home Medications:  Allergies as of 05/18/2017      Reactions   Percocet [oxycodone-acetaminophen] Nausea And Vomiting      Medication List        Accurate as of 05/18/17  9:23 AM. Always use your most recent med list.          ibuprofen 200 MG tablet Commonly known as:  ADVIL,MOTRIN Take 200-400 mg by mouth daily as needed for headache.       Allergies:  Allergies  Allergen Reactions  . Percocet [Oxycodone-Acetaminophen] Nausea And Vomiting    Family History: Family History  Problem Relation Age of Onset  . Cancer Mother        lung  . COPD Mother   . Diabetes Father   . Heart disease Father   . Hyperlipidemia Father   . Hypertension Father   . COPD Maternal Aunt     Social History:  reports that she has never smoked. She has never used smokeless tobacco. She reports that she does not drink alcohol or use drugs.  ROS: UROLOGY Frequent Urination?: No Hard to postpone urination?: No Burning/pain with urination?: No Get up at night to urinate?: No Leakage of urine?: No Urine stream starts and stops?: No Trouble starting stream?: No Do you have to strain to urinate?: No  Blood in urine?: No Urinary tract infection?: No Sexually transmitted disease?: No Injury to kidneys or bladder?: No Painful intercourse?: No Weak stream?: No Currently pregnant?: No Vaginal bleeding?: No Last menstrual period?: n  Gastrointestinal Nausea?: No Vomiting?: No Indigestion/heartburn?: No Diarrhea?: No Constipation?: No  Constitutional Fever: No Night sweats?: No Weight loss?: No Fatigue?: No  Skin Skin rash/lesions?: No Itching?: No  Eyes Blurred vision?: No Double vision?:  No  Ears/Nose/Throat Sore throat?: No Sinus problems?: No  Hematologic/Lymphatic Swollen glands?: No Easy bruising?: No  Cardiovascular Leg swelling?: No Chest pain?: No  Respiratory Cough?: No Shortness of breath?: No  Endocrine Excessive thirst?: No  Musculoskeletal Back pain?: No Joint pain?: No  Neurological Headaches?: No Dizziness?: No  Psychologic Depression?: No Anxiety?: No  Physical Exam: BP 119/78   Pulse (!) 48   Ht 5\' 3"  (1.6 m)   Wt 144 lb (65.3 kg)   BMI 25.51 kg/m   Constitutional:  Alert and oriented, No acute distress. HEENT: [place] AT, moist mucus membranes.  Trachea midline, no masses. Cardiovascular: No clubbing, cyanosis, or edema. Respiratory: Normal respiratory effort, no increased work of breathing. GI: Abdomen is soft, nontender, nondistended, no abdominal masses GU: No CVA tenderness Neurologic: Grossly intact, no focal deficits, moving all 4 extremities. Psychiatric: Normal mood and affect.  Laboratory Data: Lab Results  Component Value Date   WBC 12.3 (H) 03/08/2017   HGB 12.0 03/08/2017   HCT 35.5 03/08/2017   MCV 86.3 03/08/2017   PLT 132 (L) 03/08/2017    Lab Results  Component Value Date   CREATININE 0.95 03/09/2017     Lab Results  Component Value Date   HGBA1C 5.6 03/07/2017    Urinalysis    Component Value Date/Time   COLORURINE YELLOW (A) 03/07/2017 0921   APPEARANCEUR Cloudy (A) 04/06/2017 1455   LABSPEC 1.041 (H) 03/07/2017 0921   PHURINE 5.0 03/07/2017 0921   GLUCOSEU Negative 04/06/2017 1455   HGBUR MODERATE (A) 03/07/2017 0921   BILIRUBINUR Negative 04/06/2017 1455   KETONESUR 20 (A) 03/07/2017 0921   PROTEINUR 2+ (A) 04/06/2017 1455   PROTEINUR NEGATIVE 03/07/2017 0921   NITRITE Negative 04/06/2017 1455   NITRITE NEGATIVE 03/07/2017 0921   LEUKOCYTESUR 2+ (A) 04/06/2017 1455    Lab Results  Component Value Date   LABMICR See below: 04/06/2017   WBCUA 6-10 (A) 04/06/2017   RBCUA 3-10 (A)  04/06/2017   LABEPIT 0-10 04/06/2017   MUCUS Present (A) 04/06/2017   BACTERIA Moderate (A) 04/06/2017    Pertinent Imaging: Results for orders placed during the hospital encounter of 05/11/17  US RENAL   Narrative CLINICAL DATA:  Left hydronephrosis.  EXAM: RENAL / URINARY TRACT ULTRASOUND COMPLETE  COMPARISON:  None.  FINDINGS: Right Kidney:  Length: 10.0 cm. Echogenicity within normal limits. No mass or hydronephrosis visualized.  Left Kidney:  Length: 10.8 cm. Echogenicity within normal limits. No mass or hydronephrosis visualized.  Bladder:  Appears normal for degree of bladder distention. Bilateral ureteral jets seen.  IMPRESSION: Normal renal ultrasound.   Electronically Signed   By: Fidela Salisbury M.D.   On: 05/13/2017 22:02    Renal ultrasound personally reviewed today  Assessment & Plan:    1. Hydronephrosis, left Status post negative diagnostic left ureteroscopy Stent subsequently removed with no evidence of residual hydronephrosis Asymptomatic No further follow-up needed at this point in time  She may follow-up on an as-needed basis  Hollice Espy, MD  [facility] [address]  [place], [place] [address] [phone]

## 2017-11-13 ENCOUNTER — Other Ambulatory Visit: Payer: Self-pay

## 2017-11-13 ENCOUNTER — Encounter: Payer: Self-pay | Admitting: Nurse Practitioner

## 2017-11-13 ENCOUNTER — Ambulatory Visit (INDEPENDENT_AMBULATORY_CARE_PROVIDER_SITE_OTHER): Payer: BLUE CROSS/BLUE SHIELD | Admitting: Nurse Practitioner

## 2017-11-13 VITALS — BP 113/75 | HR 55 | Temp 98.0°F | Ht 62.0 in | Wt 151.0 lb

## 2017-11-13 DIAGNOSIS — E559 Vitamin D deficiency, unspecified: Secondary | ICD-10-CM

## 2017-11-13 DIAGNOSIS — Z23 Encounter for immunization: Secondary | ICD-10-CM

## 2017-11-13 DIAGNOSIS — Z87898 Personal history of other specified conditions: Secondary | ICD-10-CM

## 2017-11-13 NOTE — Patient Instructions (Signed)
Hepatitis B Vaccine: What You Need to Know 1. Why get vaccinated? Hepatitis B is a serious disease that affects the liver. It is caused by the hepatitis B virus. Hepatitis B can cause mild illness lasting a few weeks, or it can lead to a serious, lifelong illness. Hepatitis B virus infection can be either acute or chronic. Acute hepatitis B virus infection is a short-term illness that occurs within the first 6 months after someone is exposed to the hepatitis B virus. This can lead to:  fever, fatigue, loss of appetite, nausea, and/or vomiting  jaundice (yellow skin or eyes, dark urine, clay-colored bowel movements)  pain in muscles, joints, and stomach  Chronic hepatitis B virus infection is a long-term illness that occurs when the hepatitis B virus remains in a person's body. Most people who go on to develop chronic hepatitis B do not have symptoms, but it is still very serious and can lead to:  liver damage (cirrhosis)  liver cancer  death  Chronically-infected people can spread hepatitis B virus to others, even if they do not feel or look sick themselves. Up to 1.4 million people in the [place] may have chronic hepatitis B infection. About 90% of infants who get hepatitis B become chronically infected and about 1 out of 4 of them dies. Hepatitis B is spread when blood, semen, or other body fluid infected with the Hepatitis B virus enters the body of a person who is not infected. People can become infected with the virus through:  Birth (a baby whose mother is infected can be infected at or after birth)  Sharing items such as razors or toothbrushes with an infected person  Contact with the blood or open sores of an infected person  Sex with an infected partner  Sharing needles, syringes, or other drug-injection equipment  Exposure to blood from needlesticks or other sharp instruments  Each year about 2,000 people in the [place] die from hepatitis B-related liver  disease. Hepatitis B vaccine can prevent hepatitis B and its consequences, including liver cancer and cirrhosis. 2. Hepatitis B vaccine Hepatitis B vaccine is made from parts of the hepatitis B virus. It cannot cause hepatitis B infection. The vaccine is usually given as 3 or 4 shots over a 6-month period. Infants should get their first dose of hepatitis B vaccine at birth and will usually complete the series at 6 months of age. All children and adolescents younger than 19 years of age who have not yet gotten the vaccine should also be vaccinated. Hepatitis B vaccine is recommended for unvaccinated adults who are at risk for hepatitis B virus infection, including:  People whose sex partners have hepatitis B  Sexually active persons who are not in a long-term monogamous relationship  Persons seeking evaluation or treatment for a sexually transmitted disease  Men who have sexual contact with other men  People who share needles, syringes, or other drug-injection equipment  People who have household contact with someone infected with the hepatitis B virus  Health care and public safety workers at risk for exposure to blood or body fluids  Residents and staff of facilities for developmentally disabled persons  Persons in correctional facilities  Victims of sexual assault or abuse  Travelers to regions with increased rates of hepatitis B  People with chronic liver disease, kidney disease, HIV infection, or diabetes  Anyone who wants to be protected from hepatitis B  There are no known risks to getting hepatitis B vaccine at the   same time as other vaccines. 3. Some people should not get this vaccine Tell the person who is giving the vaccine:  If the person getting the vaccine has any severe, life-threatening allergies.  If you ever had a life-threatening allergic reaction after a dose of hepatitis B vaccine, or have a severe allergy to any part of this vaccine, you may be advised not  to get vaccinated. Ask your health care provider if you want information about vaccine components.  If the person getting the vaccine is not feeling well.  If you have a mild illness, such as a cold, you can probably get the vaccine today. If you are moderately or severely ill, you should probably wait until you recover. Your doctor can advise you.  4. Risks of a vaccine reaction With any medicine, including vaccines, there is a chance of side effects. These are usually mild and go away on their own, but serious reactions are also possible. Most people who get hepatitis B vaccine do not have any problems with it. Minor problems following hepatitis B vaccine include:  soreness where the shot was given  temperature of 99.9F or higher  If these problems occur, they usually begin soon after the shot and last 1 or 2 days. Your doctor can tell you more about these reactions. Other problems that could happen after this vaccine:  People sometimes faint after a medical procedure, including vaccination. Sitting or lying down for about 15 minutes can help prevent fainting and injuries caused by a fall. Tell your provider if you feel dizzy, or have vision changes or ringing in the ears.  Some people get shoulder pain that can be more severe and longer-lasting than the more routine soreness that can follow injections. This happens very rarely.  Any medication can cause a severe allergic reaction. Such reactions from a vaccine are very rare, estimated at about 1 in a million doses, and would happen within a few minutes to a few hours after the vaccination. As with any medicine, there is a very remote chance of a vaccine causing a serious injury or death. The safety of vaccines is always being monitored. For more information, visit: www.cdc.gov/vaccinesafety/ 5. What if there is a serious problem? What should I look for? Look for anything that concerns you, such as signs of a severe allergic reaction,  very high fever, or unusual behavior. Signs of a severe allergic reaction can include hives, swelling of the face and throat, difficulty breathing, a fast heartbeat, dizziness, and weakness. These would start a few minutes to a few hours after the vaccination. What should I do?  If you think it is a severe allergic reaction or other emergency that can't wait, call 9-1-1 or get to the nearest hospital. Otherwise, call your clinic.  Afterward, the reaction should be reported to the Vaccine Adverse Event Reporting System (VAERS). Your doctor should file this report, or you can do it yourself through the VAERS web site at www.vaers.hhs.gov, or by calling 1-800-822-7967. ? VAERS does not give medical advice. 6. The National Vaccine Injury Compensation Program The National Vaccine Injury Compensation Program (VICP) is a federal program that was created to compensate people who may have been injured by certain vaccines. Persons who believe they may have been injured by a vaccine can learn about the program and about filing a claim by calling 1-800-338-2382 or visiting the VICP website at www.hrsa.gov/vaccinecompensation. There is a time limit to file a claim for compensation. 7. How can I learn   more?  Ask your healthcare provider. He or she can give you the vaccine package insert or suggest other sources of information.  Call your local or state health department.  Contact the Centers for Disease Control and Prevention (CDC): ? Call 1-800-232-4636 (1-800-CDC-INFO) or ? Visit CDC's website at www.cdc.gov/vaccines CDC Hepatitis B VIS (07/29/2014) This information is not intended to replace advice given to you by your health care provider. Make sure you discuss any questions you have with your health care provider. Document Released: 10/20/2005 Document Revised: 09/16/2015 Document Reviewed: 09/16/2015 Elsevier Interactive Patient Education  2017 Elsevier Inc.  

## 2017-11-13 NOTE — Progress Notes (Signed)
BP 113/75   Pulse (!) 55   Temp 98 F (36.7 C) (Oral)   Ht 5\' 2"  (1.575 m)   Wt 151 lb (68.5 kg)   LMP 03/01/2017   SpO2 97%   BMI 27.62 kg/m    Subjective:    Patient ID: Julie Warren, female    DOB: 06-21-1965, 52 y.o.   MRN: [id number]  HPI: Julie Warren is a 52 y.o. female presents for initial patient visit  Chief Complaint  Patient presents with  . New Patient (Initial Visit)    pt has some paperwork to fill out    Presents for new patient visit.  Introduced to role of Designer, jewellery and office procedures, previously was a patient in office with Dr. Sanda Klein and is now returning to initiate care.  Recently had hysterectomy at [facility] on 03/27/17 by Dr. Alycia Rossetti and had benign left pelvic mass removed, she reports (and on review of records noted) the providers initially felt mass would be cancerous.  On return of pathology came back benign.  Mass was "so large it was blocking my urethra tube and caused me to go into acute renal failure".  Has had occasional hot flashes since hysterectomy, as she reported she does not want to take medications.  Hot flashes are mainly at night, reports no issues during daytime hours.  Also reports weight gain since hysterectomy, but is focused on exercise and diet.  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  Had own business in technology and that has not been doing as well, so is now working for [employer].    Has h/o Vitamin D deficiency diagnosed by Dr. Sanda Klein in past, but has not taken Vitamin D in years.  Has no h/o other chronic diseases.  Discussed with provider today paperwork that needs to be obtained for physical for [employer] employment.  Encouraged her to reach out to Health Department to obtain immunization record prior to physical next week, so provider can sign off on immunizations.  She reports she has not had Hep B series.  Educated her on this and will give first vaccine today and she is aware she will have to  return in one month for second vaccine.    Relevant past medical, surgical, family and social history reviewed and updated as indicated. Interim medical history since our last visit reviewed. Allergies and medications reviewed and updated.  Review of Systems  Constitutional: Negative for activity change, appetite change and fatigue.  Respiratory: Negative for cough, chest tightness, shortness of breath and wheezing.   Cardiovascular: Negative for chest pain, palpitations and leg swelling.  Gastrointestinal: Negative for abdominal distention, abdominal pain, constipation, diarrhea, nausea and vomiting.  Endocrine: Negative for cold intolerance, heat intolerance, polydipsia, polyphagia and polyuria.  Genitourinary: Negative.   Musculoskeletal: Negative for arthralgias, back pain, myalgias and neck pain.  Skin: Negative.   Allergic/Immunologic: Negative.   Neurological: Negative for dizziness, tremors, speech difficulty, weakness, light-headedness, numbness and headaches.  Hematological: Negative.   Psychiatric/Behavioral: Negative for behavioral problems, confusion and decreased concentration. The patient is not nervous/anxious.     Per HPI unless specifically indicated above     Objective:    BP 113/75   Pulse (!) 55   Temp 98 F (36.7 C) (Oral)   Ht 5\' 2"  (1.575 m)   Wt 151 lb (68.5 kg)   LMP 03/01/2017   SpO2 97%   BMI 27.62 kg/m   Wt Readings from Last 3 Encounters:  11/13/17 151 lb (68.5 kg)  05/18/17 144 lb (65.3 kg)  04/11/17 133 lb (60.3 kg)    Physical Exam  Constitutional: She is oriented to person, place, and time. She appears well-developed and well-nourished.  HENT:  Head: Normocephalic and atraumatic.  Right Ear: Hearing normal.  Left Ear: Hearing normal.  Nose: Nose normal.  Eyes: Pupils are equal, round, and reactive to light. Conjunctivae and EOM are normal. Right eye exhibits no discharge. Left eye exhibits no discharge.  Neck: Normal range of motion.  Neck supple. No JVD present. Carotid bruit is not present. No thyromegaly present.  Cardiovascular: Normal rate, regular rhythm, normal heart sounds and intact distal pulses.  Pulmonary/Chest: Effort normal and breath sounds normal.  Abdominal: Soft. Bowel sounds are normal. There is no splenomegaly or hepatomegaly.  Musculoskeletal: Normal range of motion.  Lymphadenopathy:    She has no cervical adenopathy.  Neurological: She is alert and oriented to person, place, and time.  Skin: Skin is warm and dry.  Psychiatric: She has a normal mood and affect. Her behavior is normal.    Results for orders placed or performed during the hospital encounter of 04/11/17  Pregnancy, urine POC  Result Value Ref Range   Preg Test, Ur NEGATIVE NEGATIVE      Assessment & Plan:   Problem List Items Addressed This Visit      Other   Vitamin D deficiency disease    Check level at physical next week,  Encouraged patient to continue supplementation at this time.      History of pelvic mass    History of benign pelvic mass removed in March at [facility] by Dr. Alycia Rossetti.  Continue to monitor.          Follow up plan: Return in about 1 week (around 11/20/2017) for has annual physical scheduled.Marland Kitchen

## 2017-11-13 NOTE — Assessment & Plan Note (Signed)
History of benign pelvic mass removed in March at [facility] by Dr. Alycia Rossetti.  Continue to monitor.

## 2017-11-13 NOTE — Assessment & Plan Note (Signed)
Check level at physical next week,  Encouraged patient to continue supplementation at this time.

## 2017-11-22 ENCOUNTER — Encounter: Payer: BLUE CROSS/BLUE SHIELD | Admitting: Nurse Practitioner

## 2017-11-27 ENCOUNTER — Ambulatory Visit (INDEPENDENT_AMBULATORY_CARE_PROVIDER_SITE_OTHER): Payer: BLUE CROSS/BLUE SHIELD | Admitting: Nurse Practitioner

## 2017-11-27 ENCOUNTER — Encounter: Payer: Self-pay | Admitting: Nurse Practitioner

## 2017-11-27 VITALS — BP 124/75 | HR 55 | Temp 98.1°F | Ht 62.0 in | Wt 154.4 lb

## 2017-11-27 DIAGNOSIS — E559 Vitamin D deficiency, unspecified: Secondary | ICD-10-CM | POA: Diagnosis not present

## 2017-11-27 DIAGNOSIS — R7301 Impaired fasting glucose: Secondary | ICD-10-CM

## 2017-11-27 DIAGNOSIS — R011 Cardiac murmur, unspecified: Secondary | ICD-10-CM | POA: Diagnosis not present

## 2017-11-27 DIAGNOSIS — Z0000 Encounter for general adult medical examination without abnormal findings: Secondary | ICD-10-CM | POA: Diagnosis not present

## 2017-11-27 DIAGNOSIS — N6002 Solitary cyst of left breast: Secondary | ICD-10-CM | POA: Diagnosis not present

## 2017-11-27 DIAGNOSIS — Z1211 Encounter for screening for malignant neoplasm of colon: Secondary | ICD-10-CM

## 2017-11-27 NOTE — Patient Instructions (Signed)
Colonoscopy, Adult A colonoscopy is an exam to look at the entire large intestine. During the exam, a lubricated, bendable tube is inserted into the anus and then passed into the rectum, colon, and other parts of the large intestine. A colonoscopy is often done as a part of normal colorectal screening or in response to certain symptoms, such as anemia, persistent diarrhea, abdominal pain, and blood in the stool. The exam can help screen for and diagnose medical problems, including:  Tumors.  Polyps.  Inflammation.  Areas of bleeding.  Tell a health care provider about:  Any allergies you have.  All medicines you are taking, including vitamins, herbs, eye drops, creams, and over-the-counter medicines.  Any problems you or family members have had with anesthetic medicines.  Any blood disorders you have.  Any surgeries you have had.  Any medical conditions you have.  Any problems you have had passing stool. What are the risks? Generally, this is a safe procedure. However, problems may occur, including:  Bleeding.  A tear in the intestine.  A reaction to medicines given during the exam.  Infection (rare).  What happens before the procedure? Eating and drinking restrictions Follow instructions from your health care provider about eating and drinking, which may include:  A few days before the procedure - follow a low-fiber diet. Avoid nuts, seeds, dried fruit, raw fruits, and vegetables.  1-3 days before the procedure - follow a clear liquid diet. Drink only clear liquids, such as clear broth or bouillon, black coffee or tea, clear juice, clear soft drinks or sports drinks, gelatin dessert, and popsicles. Avoid any liquids that contain red or purple dye.  On the day of the procedure - do not eat or drink anything during the 2 hours before the procedure, or within the time period that your health care provider recommends.  Bowel prep If you were prescribed an oral bowel prep  to clean out your colon:  Take it as told by your health care provider. Starting the day before your procedure, you will need to drink a large amount of medicated liquid. The liquid will cause you to have multiple loose stools until your stool is almost clear or light green.  If your skin or anus gets irritated from diarrhea, you may use these to relieve the irritation: ? Medicated wipes, such as adult wet wipes with aloe and vitamin E. ? A skin soothing-product like petroleum jelly.  If you vomit while drinking the bowel prep, take a break for up to 60 minutes and then begin the bowel prep again. If vomiting continues and you cannot take the bowel prep without vomiting, call your health care provider.  General instructions  Ask your health care provider about changing or stopping your regular medicines. This is especially important if you are taking diabetes medicines or blood thinners.  Plan to have someone take you home from the hospital or clinic. What happens during the procedure?  An IV tube may be inserted into one of your veins.  You will be given medicine to help you relax (sedative).  To reduce your risk of infection: ? Your health care team will wash or sanitize their hands. ? Your anal area will be washed with soap.  You will be asked to lie on your side with your knees bent.  Your health care provider will lubricate a long, thin, flexible tube. The tube will have a camera and a light on the end.  The tube will be inserted into your   anus.  The tube will be gently eased through your rectum and colon.  Air will be delivered into your colon to keep it open. You may feel some pressure or cramping.  The camera will be used to take images during the procedure.  A small tissue sample may be removed from your body to be examined under a microscope (biopsy). If any potential problems are found, the tissue will be sent to a lab for testing.  If small polyps are found, your  health care provider may remove them and have them checked for cancer cells.  The tube that was inserted into your anus will be slowly removed. The procedure may vary among health care providers and hospitals. What happens after the procedure?  Your blood pressure, heart rate, breathing rate, and blood oxygen level will be monitored until the medicines you were given have worn off.  Do not drive for 24 hours after the exam.  You may have a small amount of blood in your stool.  You may pass gas and have mild abdominal cramping or bloating due to the air that was used to inflate your colon during the exam.  It is up to you to get the results of your procedure. Ask your health care provider, or the department performing the procedure, when your results will be ready. This information is not intended to replace advice given to you by your health care provider. Make sure you discuss any questions you have with your health care provider. Document Released: 12/24/1999 Document Revised: 10/27/2015 Document Reviewed: 03/09/2015 Elsevier Interactive Patient Education  2018 Elsevier Inc.  

## 2017-11-27 NOTE — Assessment & Plan Note (Signed)
Systolic Grade 1 with no radiation.  Pt reports this has been ongoing, noted at [facility] and no recommendation for follow-up.  They had reported they would monitor clinically.  Pt asymptomatic. °

## 2017-11-27 NOTE — Assessment & Plan Note (Signed)
Multiple 1 cm or less nodules noted on 2018 mammogram at [facility].  Patient is followed yearly by [facility] for mammogram and will notify provider if any change in results.

## 2017-11-27 NOTE — Assessment & Plan Note (Addendum)
CMP and A1C today.

## 2017-11-27 NOTE — Progress Notes (Signed)
BP 124/75 (BP Location: Left Arm, Patient Position: Sitting, Cuff Size: Normal)   Pulse (!) 55 Comment: apical  Temp 98.1 F (36.7 C)   Ht 5\' 2"  (1.575 m)   Wt 154 lb 6 oz (70 kg)   LMP 03/01/2017   SpO2 97%   BMI 28.24 kg/m    Subjective:    Patient ID: Julie Warren, female    DOB: [birth year], 52 y.o.   MRN: [id number]  HPI: CATELYNN SPARGER is a 52 y.o. female presents for annual physical exam.  Has no concerns today or questions.  Had recent pap at [facility] this year.  Saw dermatology at [facility] 11/27/17 for actinic keratosis.  Requires mammogram, which she gets at [facility], and colonoscopy.  Chief Complaint  Patient presents with  . Annual Exam   VITAMIN D DEFICIENCY: Has history of deficiency.  No current supplement.  Denies fatigue, myalgia, fractures, or falls.  She also has h/o impaired fasting glucose per records and her report.  Social History   Socioeconomic History  . Marital status: Married    Spouse name: Not on file  . Number of children: Not on file  . Years of education: Not on file  . Highest education level: Not on file  Occupational History  . Not on file  Social Needs  . Financial resource strain: Not on file  . Food insecurity:    Worry: Not on file    Inability: Not on file  . Transportation needs:    Medical: Not on file    Non-medical: Not on file  Tobacco Use  . Smoking status: Never Smoker  . Smokeless tobacco: Never Used  Substance and Sexual Activity  . Alcohol use: No  . Drug use: No  . Sexual activity: Yes  Lifestyle  . Physical activity:    Days per week: Not on file    Minutes per session: Not on file  . Stress: Not on file  Relationships  . Social connections:    Talks on phone: Not on file    Gets together: Not on file    Attends religious service: Not on file    Active member of club or organization: Not on file    Attends meetings of clubs or organizations: Not on file    Relationship status: Not on file  . Intimate partner  violence:    Fear of current or ex partner: Not on file    Emotionally abused: Not on file    Physically abused: Not on file    Forced sexual activity: Not on file  Other Topics Concern  . Not on file  Social History Narrative  . Not on file   Depression screen [facility] 2/9 11/27/2017 07/27/2014  Decreased Interest 0 0  Down, Depressed, Hopeless 0 0  PHQ - 2 Score 0 0  Altered sleeping 0 -  Tired, decreased energy 0 -  Change in appetite 0 -  Feeling bad or failure about yourself  0 -  Trouble concentrating 0 -  Moving slowly or fidgety/restless 0 -  Suicidal thoughts 0 -  PHQ-9 Score 0 -  Difficult doing work/chores Not difficult at all -    GAD 7 : Generalized Anxiety Score 11/27/2017  Nervous, Anxious, on Edge 0  Control/stop worrying 0  Worry too much - different things 0  Trouble relaxing 0  Restless 0  Easily annoyed or irritable 0  Afraid - awful might happen 0  Total GAD 7 Score 0  Anxiety Difficulty Not difficult at all   Functional Status Survey: Is the patient deaf or have difficulty hearing?: No Does the patient have difficulty seeing, even when wearing glasses/contacts?: No Does the patient have difficulty concentrating, remembering, or making decisions?: No Does the patient have difficulty walking or climbing stairs?: No Does the patient have difficulty dressing or bathing?: No Does the patient have difficulty doing errands alone such as visiting a doctor's office or shopping?: No  Relevant past medical, surgical, family and social history reviewed and updated as indicated. Interim medical history since our last visit reviewed. Allergies and medications reviewed and updated.  Review of Systems  Constitutional: Negative for activity change, appetite change, fatigue, fever and unexpected weight change.  HENT: Negative.   Eyes: Negative.   Respiratory: Negative for cough, chest tightness and shortness of breath.   Cardiovascular: Negative for chest pain,  palpitations and leg swelling.  Gastrointestinal: Negative for abdominal distention, abdominal pain, constipation, diarrhea, nausea and vomiting.  Endocrine: Negative for cold intolerance, heat intolerance, polydipsia, polyphagia and polyuria.  Genitourinary: Negative.   Musculoskeletal: Negative.   Skin: Negative.   Allergic/Immunologic: Negative.   Neurological: Negative for dizziness, numbness and headaches.  Hematological: Negative.   Psychiatric/Behavioral: Negative.    Per HPI unless specifically indicated above     Objective:    BP 124/75 (BP Location: Left Arm, Patient Position: Sitting, Cuff Size: Normal)   Pulse (!) 55 Comment: apical  Temp 98.1 F (36.7 C)   Ht 5\' 2"  (1.575 m)   Wt 154 lb 6 oz (70 kg)   LMP 03/01/2017   SpO2 97%   BMI 28.24 kg/m   Wt Readings from Last 3 Encounters:  11/27/17 154 lb 6 oz (70 kg)  11/13/17 151 lb (68.5 kg)  05/18/17 144 lb (65.3 kg)    Physical Exam  Constitutional: She is oriented to person, place, and time. She appears well-developed and well-nourished.  HENT:  Head: Normocephalic and atraumatic.  Right Ear: Hearing, tympanic membrane, external ear and ear canal normal.  Left Ear: Hearing, tympanic membrane, external ear and ear canal normal.  Nose: Nose normal. Right sinus exhibits no maxillary sinus tenderness and no frontal sinus tenderness. Left sinus exhibits no maxillary sinus tenderness and no frontal sinus tenderness.  Mouth/Throat: Oropharynx is clear and moist.  Eyes: Pupils are equal, round, and reactive to light. Conjunctivae and EOM are normal. Right eye exhibits no discharge. Left eye exhibits no discharge.  Neck: Normal range of motion. Neck supple. No JVD present. Carotid bruit is not present. No thyromegaly present.  Cardiovascular: Normal rate, regular rhythm, S1 normal, S2 normal and intact distal pulses.  Murmur heard.  Systolic murmur is present with a grade of 1/6. Pulmonary/Chest: Effort normal and breath  sounds normal. Right breast exhibits no inverted nipple, no mass, no nipple discharge, no skin change and no tenderness. Left breast exhibits no inverted nipple, no mass, no nipple discharge, no skin change and no tenderness.    Abdominal: Soft. Bowel sounds are normal. There is no splenomegaly or hepatomegaly.  Musculoskeletal: Normal range of motion.  Lymphadenopathy:    She has no cervical adenopathy.  Neurological: She is alert and oriented to person, place, and time. She has normal reflexes.  Reflex Scores:      Brachioradialis reflexes are 2+ on the right side and 2+ on the left side.      Patellar reflexes are 2+ on the right side and 2+ on the left side. Skin: Skin is  warm and dry.  Psychiatric: She has a normal mood and affect. Her behavior is normal.    Results for orders placed or performed in visit on 11/27/17  HM PAP SMEAR  Result Value Ref Range   HM Pap smear normal       Assessment & Plan:   Problem List Items Addressed This Visit      Endocrine   IFG (impaired fasting glucose)    CMP and A1C today.      Relevant Orders   Comprehensive metabolic panel   HgB W5I     Other   Vitamin D deficiency disease    Vitamin D level today.      Relevant Orders   VITAMIN D 25 Hydroxy (Vit-D Deficiency, Fractures)   Benign breast cyst in female, left    Multiple 1 cm or less nodules noted on 2018 mammogram at [facility].  Patient is followed yearly by [facility] for mammogram and will notify provider if any change in results.      Heart murmur    Systolic Grade 1 with no radiation.  Pt reports this has been ongoing, noted at [facility] and no recommendation for follow-up.  They had reported they would monitor clinically.  Pt asymptomatic.       Other Visit Diagnoses    Annual physical exam    -  Primary   Relevant Orders   CBC with Differential/Platelet   Lipid Panel w/o Chol/HDL Ratio   Comprehensive metabolic panel   TSH   Screening for colon cancer       Relevant Orders    Ambulatory referral to Gastroenterology       Follow up plan: Return in about 6 months (around 05/28/2018) for Vit D Deficiency.

## 2017-11-27 NOTE — Assessment & Plan Note (Addendum)
Vitamin D level today

## 2017-11-28 ENCOUNTER — Telehealth: Payer: Self-pay | Admitting: Nurse Practitioner

## 2017-11-28 DIAGNOSIS — E7800 Pure hypercholesterolemia, unspecified: Secondary | ICD-10-CM

## 2017-11-28 DIAGNOSIS — E559 Vitamin D deficiency, unspecified: Secondary | ICD-10-CM

## 2017-11-28 DIAGNOSIS — R7303 Prediabetes: Secondary | ICD-10-CM

## 2017-11-28 LAB — COMPREHENSIVE METABOLIC PANEL
ALBUMIN: 4.4 g/dL (ref 3.5–5.5)
ALT: 17 IU/L (ref 0–32)
AST: 20 IU/L (ref 0–40)
Albumin/Globulin Ratio: 1.8 (ref 1.2–2.2)
Alkaline Phosphatase: 94 IU/L (ref 39–117)
BUN / CREAT RATIO: 19 (ref 9–23)
BUN: 14 mg/dL (ref 6–24)
Bilirubin Total: 0.3 mg/dL (ref 0.0–1.2)
CALCIUM: 9.3 mg/dL (ref 8.7–10.2)
CO2: 26 mmol/L (ref 20–29)
CREATININE: 0.73 mg/dL (ref 0.57–1.00)
Chloride: 104 mmol/L (ref 96–106)
GFR calc Af Amer: 110 mL/min/{1.73_m2} (ref >59)
GFR calc non Af Amer: 95 mL/min/{1.73_m2} (ref >59)
GLUCOSE: 90 mg/dL (ref 65–99)
Globulin, Total: 2.4 g/dL (ref 1.5–4.5)
Potassium: 3.9 mmol/L (ref 3.5–5.2)
Sodium: 141 mmol/L (ref 134–144)
Total Protein: 6.8 g/dL (ref 6.0–8.5)

## 2017-11-28 LAB — CBC WITH DIFFERENTIAL/PLATELET
BASOS: 1 %
Basophils Absolute: 0 10*3/uL (ref 0.0–0.2)
EOS (ABSOLUTE): 0.1 10*3/uL (ref 0.0–0.4)
EOS: 2 %
HEMATOCRIT: 37.4 % (ref 34.0–46.6)
Hemoglobin: 13 g/dL (ref 11.1–15.9)
IMMATURE GRANULOCYTES: 0 %
Immature Grans (Abs): 0 10*3/uL (ref 0.0–0.1)
LYMPHS ABS: 2.5 10*3/uL (ref 0.7–3.1)
Lymphs: 39 %
MCH: 30 pg (ref 26.6–33.0)
MCHC: 34.8 g/dL (ref 31.5–35.7)
MCV: 86 fL (ref 79–97)
Monocytes Absolute: 0.6 10*3/uL (ref 0.1–0.9)
Monocytes: 9 %
NEUTROS ABS: 3.1 10*3/uL (ref 1.4–7.0)
Neutrophils: 49 %
Platelets: 208 10*3/uL (ref 150–450)
RBC: 4.33 x10E6/uL (ref 3.77–5.28)
RDW: 13.1 % (ref 12.3–15.4)
WBC: 6.4 10*3/uL (ref 3.4–10.8)

## 2017-11-28 LAB — LIPID PANEL W/O CHOL/HDL RATIO
Cholesterol, Total: 200 mg/dL — ABNORMAL HIGH (ref 100–199)
HDL: 41 mg/dL (ref >39)
LDL Calculated: 121 mg/dL — ABNORMAL HIGH (ref 0–99)
Triglycerides: 191 mg/dL — ABNORMAL HIGH (ref 0–149)
VLDL Cholesterol Cal: 38 mg/dL (ref 5–40)

## 2017-11-28 LAB — VITAMIN D 25 HYDROXY (VIT D DEFICIENCY, FRACTURES): VIT D 25 HYDROXY: 22.7 ng/mL — AB (ref 30.0–100.0)

## 2017-11-28 LAB — HEMOGLOBIN A1C
ESTIMATED AVERAGE GLUCOSE: 117 mg/dL
HEMOGLOBIN A1C: 5.7 % — AB (ref 4.8–5.6)

## 2017-11-28 LAB — TSH: TSH: 2.37 u[IU]/mL (ref 0.450–4.500)

## 2017-11-28 NOTE — Telephone Encounter (Signed)
Called and left general message with patient to return call to office to review labs.  Will review the following:  Cholesterol panel shows slightly elevated cholesterol at 200 and LDL at 121, goal is to get cholesterol <200 and LDL <100.  Current cardiac risk score on calculation is 1.9%, showing low risk at this time and recommendation for focus on diet.  Would recommend she view the [place] web site, which has lots of information on low cholesterol diet.  There are also many web sites and cook books available that have wonderful recipes and information on cooking with focus on low cholesterol.  Her A1C showed level of 5.7, with levels of 5.7 to 6.4 being in range of prediabetes.  We do not need to do medication at this time, but I do recommend focusing on diet and low sugar/carbs. Also exercise 5 days a week for 30 minutes.  Vitamin D level was low at 22.7, would like it to be >30.  Recommend starting Vitamin D supplement 1000 units once a day, which she can obtain over the counter at any drugstore or [employer] or [employer] at a low cost.  We will recheck Vitamin D level at her next appointment.  Have a wonderful Thanksgiving and Christmas.

## 2017-11-28 NOTE — Assessment & Plan Note (Signed)
LDL 121 and TCHOL 200 with calculated ASCVD 11/28/17 of 1.9%.  Recommend focus on diet and exercise at this time.

## 2017-11-28 NOTE — Assessment & Plan Note (Signed)
A1C 5.7, recommend focus on diet and exercise at this time.  Recheck annually.

## 2017-11-28 NOTE — Telephone Encounter (Signed)
Spoke to patient this afternoon and updated her on lab results and recommendations.

## 2017-11-28 NOTE — Assessment & Plan Note (Signed)
Start Vitamin D 1000 units every day and recheck level at next appointment.  Goal >30.

## 2017-12-10 LAB — HM MAMMOGRAPHY

## 2017-12-12 ENCOUNTER — Encounter: Payer: Self-pay | Admitting: *Deleted

## 2017-12-12 ENCOUNTER — Ambulatory Visit: Payer: BLUE CROSS/BLUE SHIELD

## 2017-12-12 ENCOUNTER — Ambulatory Visit (INDEPENDENT_AMBULATORY_CARE_PROVIDER_SITE_OTHER): Payer: BLUE CROSS/BLUE SHIELD

## 2017-12-12 DIAGNOSIS — Z23 Encounter for immunization: Secondary | ICD-10-CM

## 2018-05-28 ENCOUNTER — Ambulatory Visit: Payer: BLUE CROSS/BLUE SHIELD | Admitting: Nurse Practitioner

## 2018-11-10 IMAGING — US US ART/VEN ABD/PELV/SCROTUM DOPPLER LTD
1 series · 13 of 25 positions shown · non-contrast
Comparison: Prior CT from earlier the same day.

CLINICAL DATA: Initial evaluation for pelvic mass, left lower
quadrant pain.

EXAM:
TRANSABDOMINAL AND TRANSVAGINAL ULTRASOUND OF PELVIS
DOPPLER ULTRASOUND OF OVARIES
TECHNIQUE: Both transabdominal and transvaginal ultrasound examinations of the
pelvis were performed. Transabdominal technique was performed for
global imaging of the pelvis including uterus, ovaries, adnexal
regions, and pelvic cul-de-sac.
It was necessary to proceed with endovaginal exam following the
transabdominal exam to visualize the uterus and ovaries. Color and
duplex Doppler ultrasound was utilized to evaluate blood flow to the
ovaries.

[Series 1: us art/ven abd/pelv/scrotum doppler ltd · 0.22mm/px · 13 of 123 slices shown]
[im 1/123]
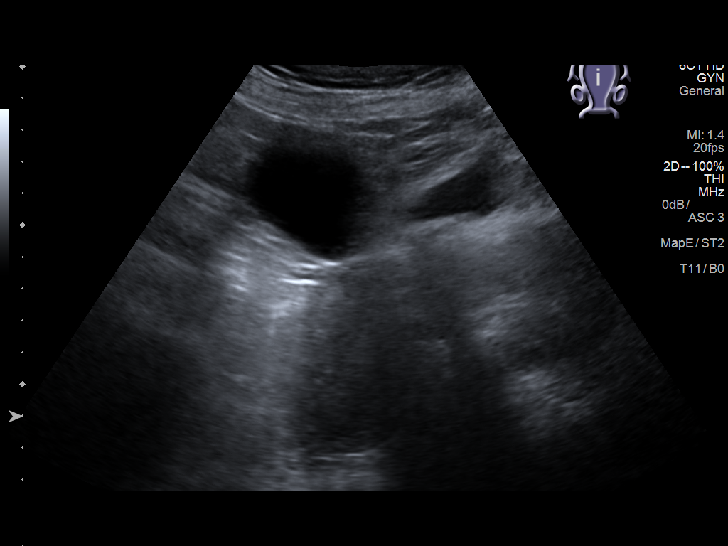
[im 11/123]
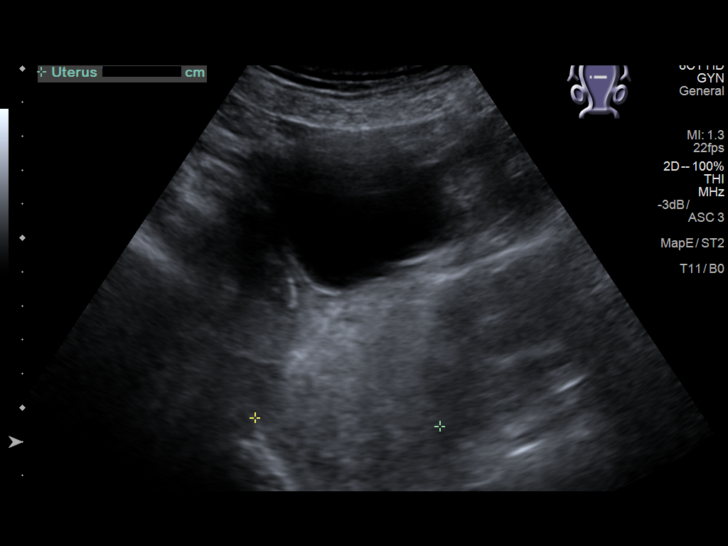
[im 21/123]
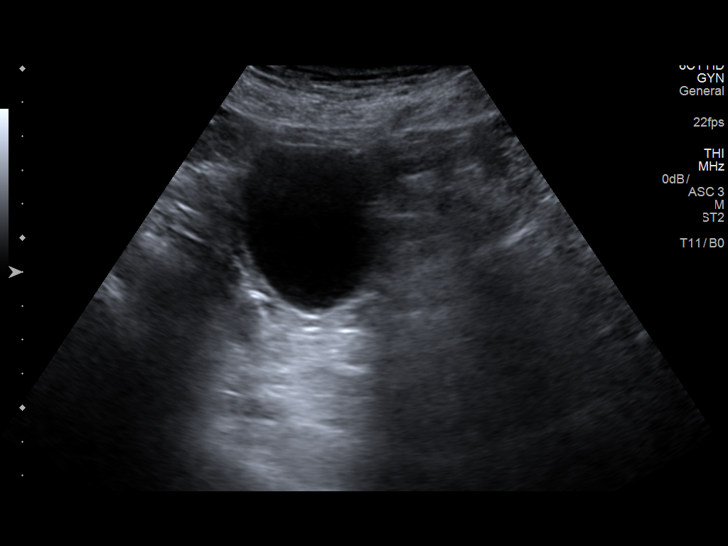
[im 31/123]
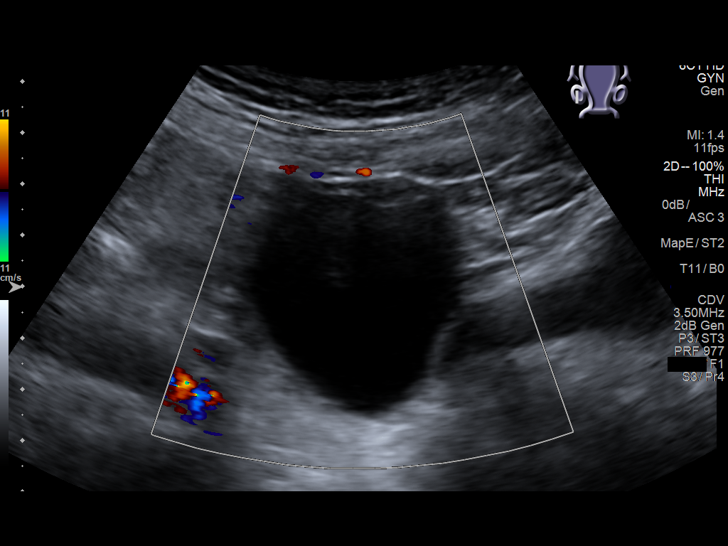
[im 41/123]
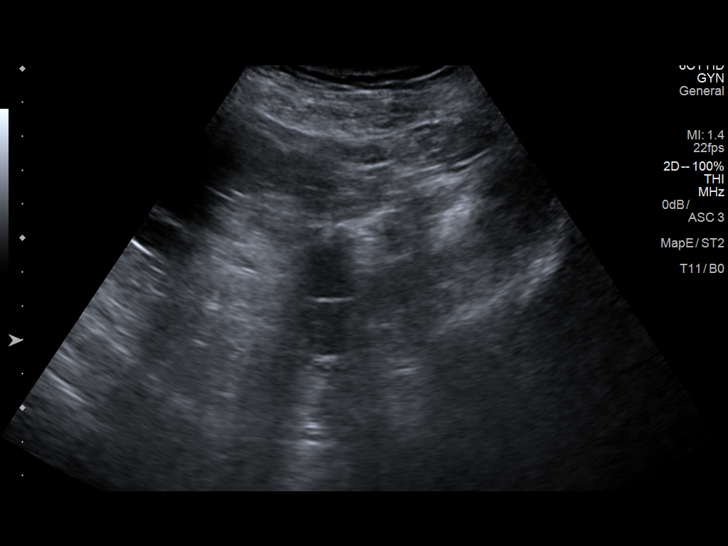
[im 51/123]
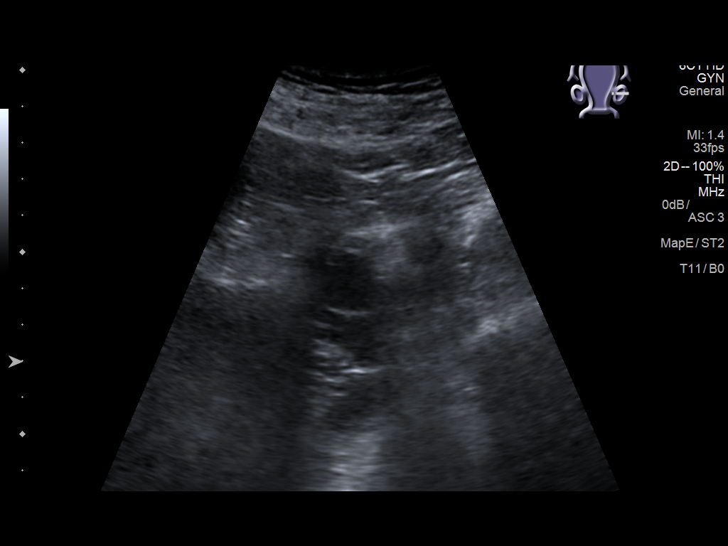
[im 62/123]
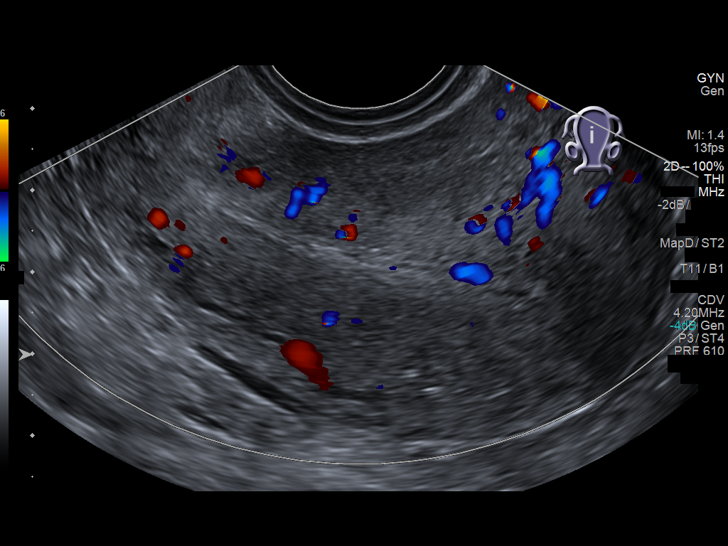
[im 72/123]
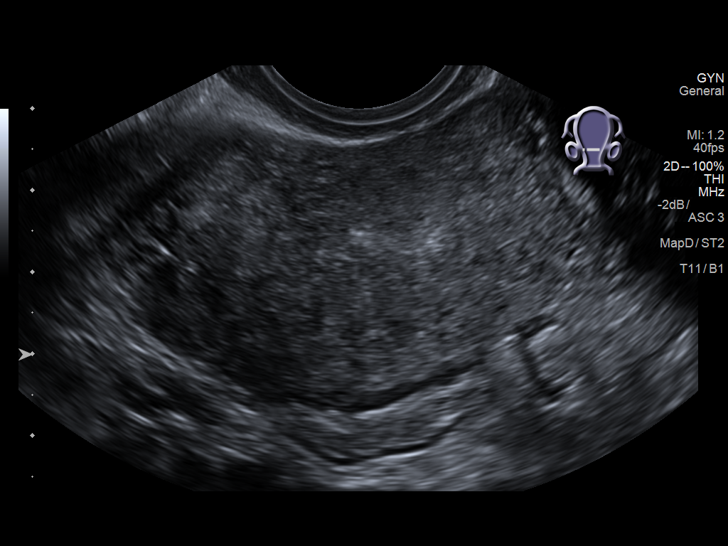
[im 82/123]
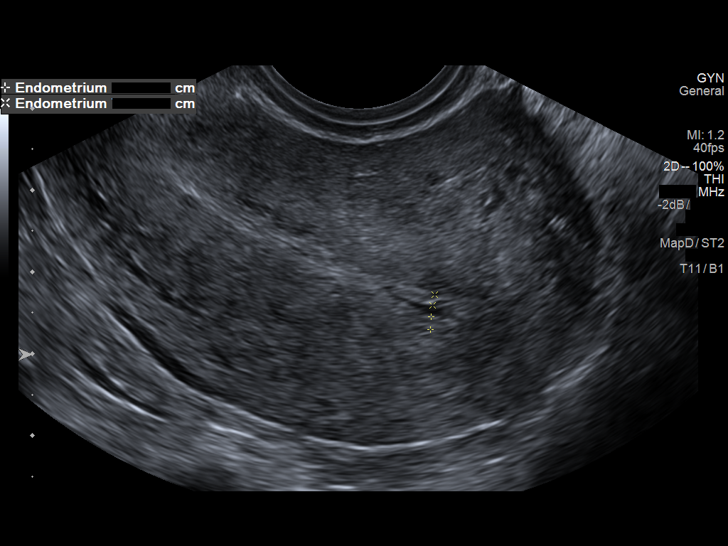
[im 92/123]
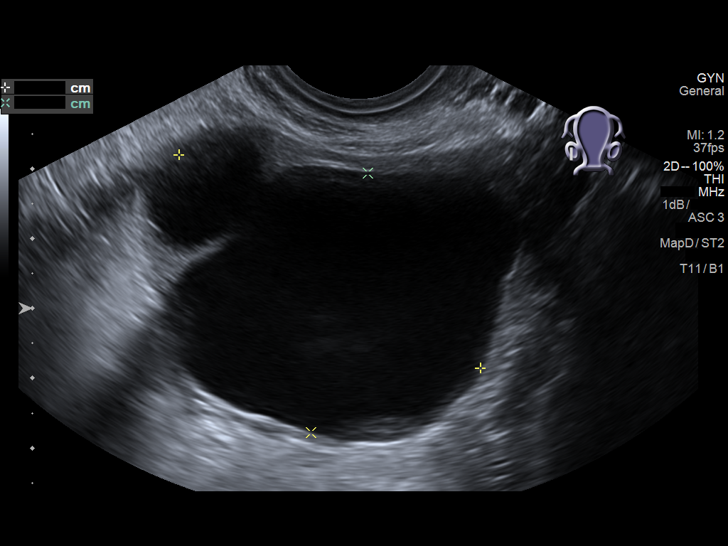
[im 102/123]
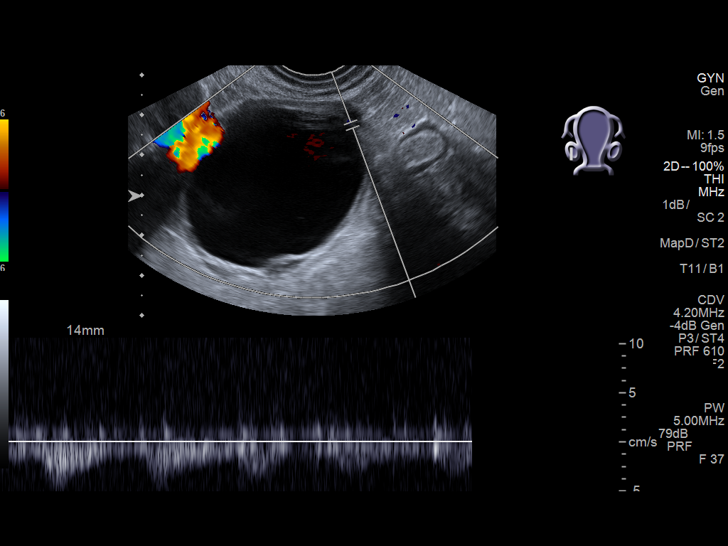
[im 112/123]
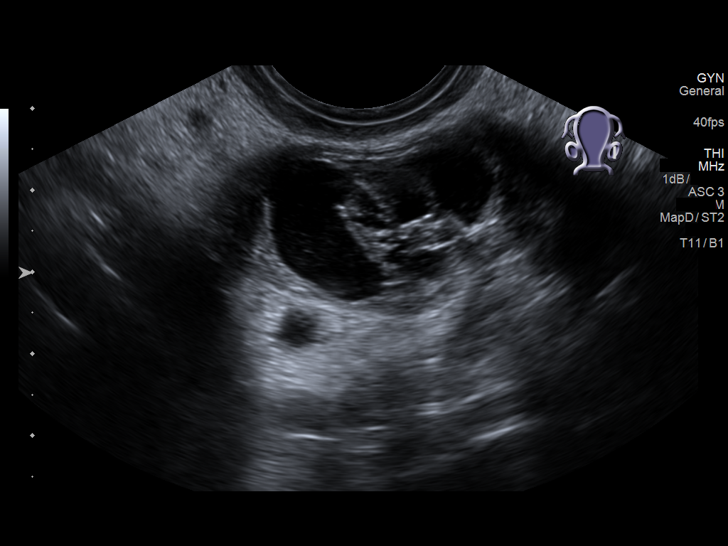
[im 123/123]
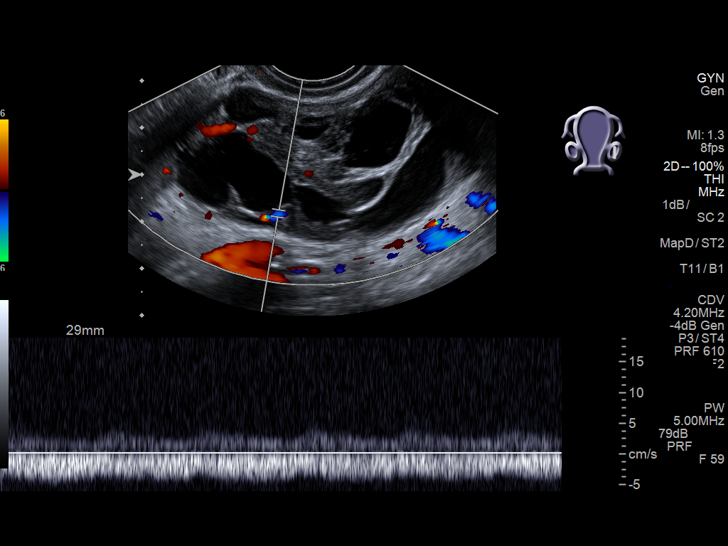

[13 of 25 positions shown; findings below may reference images not displayed]

FINDINGS: Uterus

Measurements: 7.3 x 4.6 x 5.3 cm. No fibroids or other mass
visualized.

Endometrium

Thickness: 2.9 mm. No focal abnormality visualized. Trace free fluid
noted within the endometrial cavity.

Right ovary

Measurements: 5.8 x 4.4 x 4.5 cm. 5.3 x 3.8 x 4.8 cm septated cystic
mass seen arising from the right ovary. Single curvilinear internal
septation measuring up to 3 mm present. No internal vascularity or
solid components.

Left ovary

5.8 x 3.3 x 4.6 cm complex multi septate mass essentially replaces
the left ovary. No definite ovarian tissue identified. Scattered
areas of internal vascularity with probable solid components.

Pulsed Doppler evaluation of both ovaries demonstrates normal
low-resistance arterial and venous waveforms.

Other findings

No abnormal free fluid.
IMPRESSION: 1. Bilateral complex cystic adnexal masses as above, highly
concerning on the left for ovarian neoplasm, somewhat more
indeterminate on the right. Gynecologic referral for surgical
consultation recommended. Additionally, further evaluation with
dedicated pelvic MRI, with and without gadolinium, could be
performed for further characterization as clinically desired.
2. No evidence for ovarian torsion.

## 2018-12-09 ENCOUNTER — Ambulatory Visit (INDEPENDENT_AMBULATORY_CARE_PROVIDER_SITE_OTHER): Payer: BLUE CROSS/BLUE SHIELD | Admitting: Family Medicine

## 2018-12-09 ENCOUNTER — Encounter: Payer: Self-pay | Admitting: Family Medicine

## 2018-12-09 ENCOUNTER — Other Ambulatory Visit: Payer: Self-pay

## 2018-12-09 VITALS — BP 124/78 | HR 52 | Temp 98.2°F | Wt 143.0 lb

## 2018-12-09 DIAGNOSIS — H00012 Hordeolum externum right lower eyelid: Secondary | ICD-10-CM | POA: Diagnosis not present

## 2018-12-09 MED ORDER — ERYTHROMYCIN 5 MG/GM OP OINT: 1.0000 "application " | TOPICAL_OINTMENT | Freq: Three times a day (TID) | OPHTHALMIC | 0 refills | Status: DC | PRN

## 2018-12-09 NOTE — Progress Notes (Signed)
   BP 124/78   Pulse (!) 52   Temp 98.2 F (36.8 C) (Oral)   Wt 143 lb (64.9 kg)   LMP 03/01/2017   SpO2 96%   BMI 26.16 kg/m    Subjective:    Patient ID: Julie Warren, female    DOB: 10-09-1965, 53 y.o.   MRN: [id number]  HPI: Julie Warren is a 53 y.o. female  Chief Complaint  Patient presents with  . Conjunctivitis    right eye swelling, sore, red and itchy. started with itching last Friday. has tried otc benadryl   Right eye was sore last night and this morning entire area around eye was significantly swollen and tender. This has improved throughout the day and now there is a visible stye of right lower eyelid. States vision not impacted. Has not been trying anything OTC. No new makeup products, discharge, fevers, headaches.   Relevant past medical, surgical, family and social history reviewed and updated as indicated. Interim medical history since our last visit reviewed. Allergies and medications reviewed and updated.  Review of Systems  Per HPI unless specifically indicated above     Objective:    BP 124/78   Pulse (!) 52   Temp 98.2 F (36.8 C) (Oral)   Wt 143 lb (64.9 kg)   LMP 03/01/2017   SpO2 96%   BMI 26.16 kg/m   Wt Readings from Last 3 Encounters:  12/09/18 143 lb (64.9 kg)  11/27/17 154 lb 6 oz (70 kg)  11/13/17 151 lb (68.5 kg)    Physical Exam Vitals signs and nursing note reviewed.  Constitutional:      Appearance: Normal appearance. She is not ill-appearing.  HENT:     Head: Atraumatic.  Eyes:     Extraocular Movements: Extraocular movements intact.     Conjunctiva/sclera: Conjunctivae normal.     Comments: Erythematous stye present on medial right lower eyelid, not draining  Neck:     Musculoskeletal: Normal range of motion and neck supple.  Cardiovascular:     Rate and Rhythm: Normal rate and regular rhythm.     Heart sounds: Normal heart sounds.  Pulmonary:     Effort: Pulmonary effort is normal.     Breath sounds: Normal  breath sounds.  Musculoskeletal: Normal range of motion.  Skin:    General: Skin is warm and dry.  Neurological:     Mental Status: She is alert and oriented to person, place, and time.  Psychiatric:        Mood and Affect: Mood normal.        Thought Content: Thought content normal.        Judgment: Judgment normal.   Declines visual acuity exam  Results for orders placed or performed in visit on 12/11/17  HM MAMMOGRAPHY  Result Value Ref Range   HM Mammogram 0-4 Bi-Rad 0-4 Bi-Rad, Self Reported Normal      Assessment & Plan:   Problem List Items Addressed This Visit    None    Visit Diagnoses    Hordeolum externum of right lower eyelid    -  Primary   Tx with warm compresses, erythromycin ointment, avoid makeup until fully resolved       Follow up plan: Return if symptoms worsen or fail to improve.

## 2019-10-15 ENCOUNTER — Encounter: Payer: BLUE CROSS/BLUE SHIELD | Admitting: Nurse Practitioner

## 2019-10-19 IMAGING — CT CT ABD-PELV W/ CM
2 of 5 series · 16 of 46 positions shown, 18 images · IV contrast (APPLIED)
Comparison: None

CLINICAL DATA: Acute onset of LEFT side abdominal pain yesterday
with nausea, vomiting and constipation

EXAM:
CT ABDOMEN AND PELVIS WITH CONTRAST
TECHNIQUE: Multidetector CT imaging of the abdomen and pelvis was performed
using the standard protocol following bolus administration of
intravenous contrast. Sagittal and coronal MPR images reconstructed
from axial data set.
CONTRAST:  75mL 9GE5T0-R22 IOPAMIDOL ([id number]) INJECTION 61% IV.
No oral contrast.

[Series 2: routine abd/pel with · axial · 0.74mm/px · z∈[-435,-50]mm · 13 of 89 slices shown, 15 images]
[im 6/89  soft-tissue]
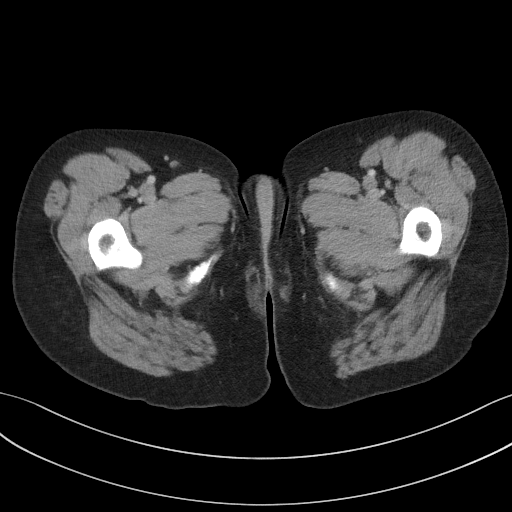
[im 6/89  bone]
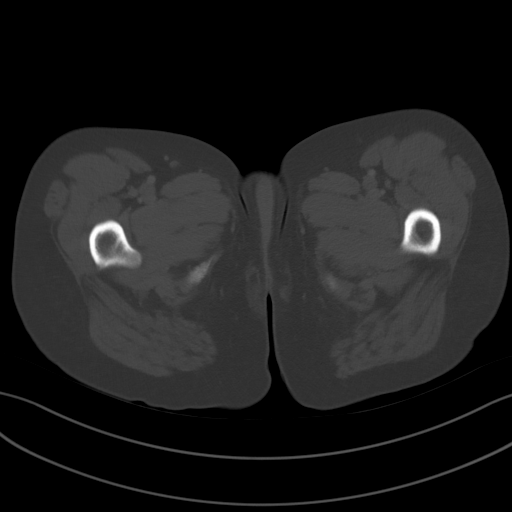
[im 11/89  soft-tissue]
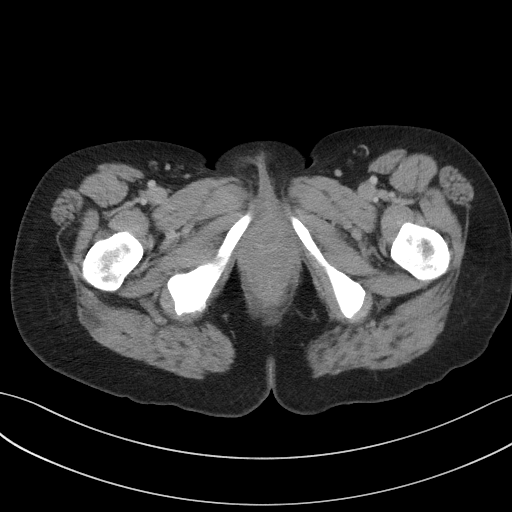
[im 21/89  soft-tissue]
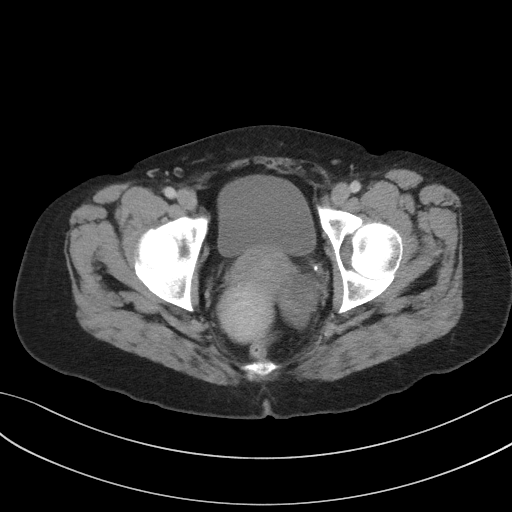
[im 26/89  soft-tissue]
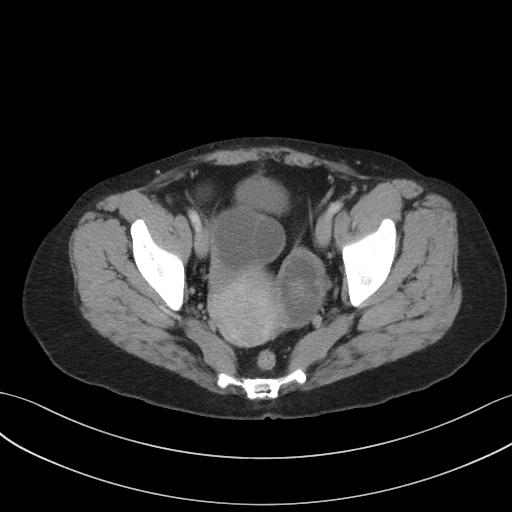
[im 32/89  soft-tissue]
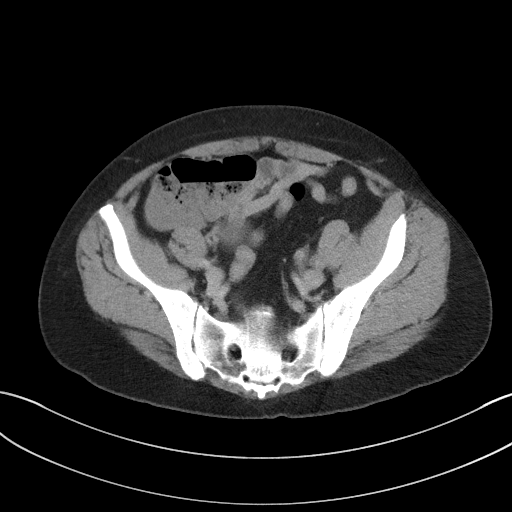
[im 37/89  soft-tissue]
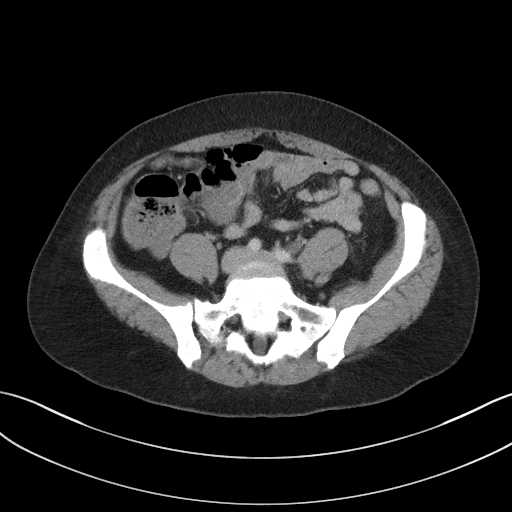
[im 47/89  soft-tissue]
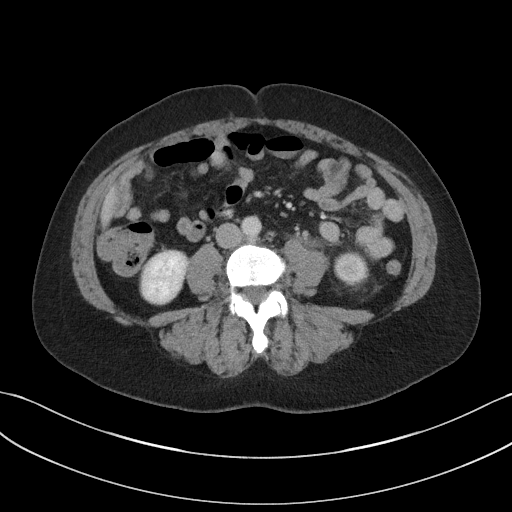
[im 52/89  soft-tissue]
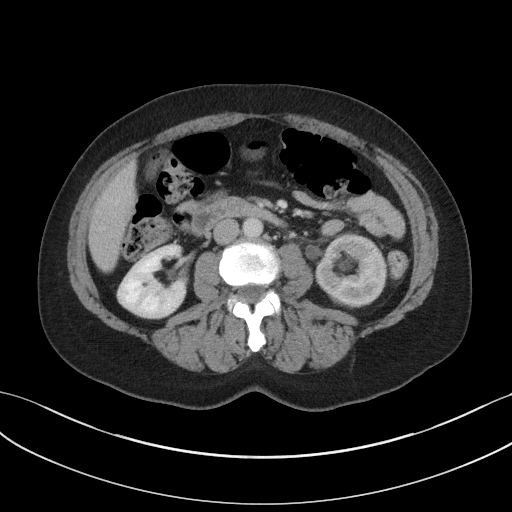
[im 57/89  soft-tissue]
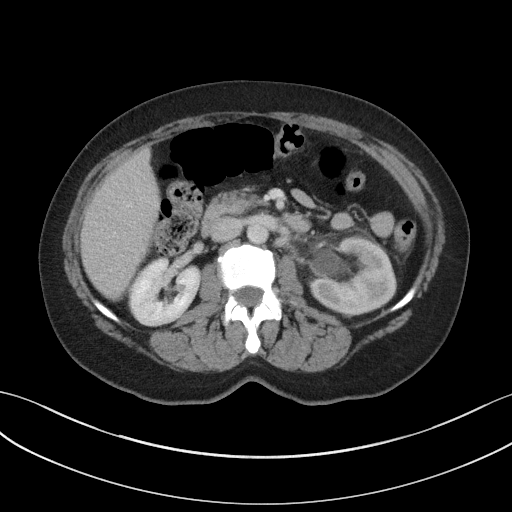
[im 57/89  bone]
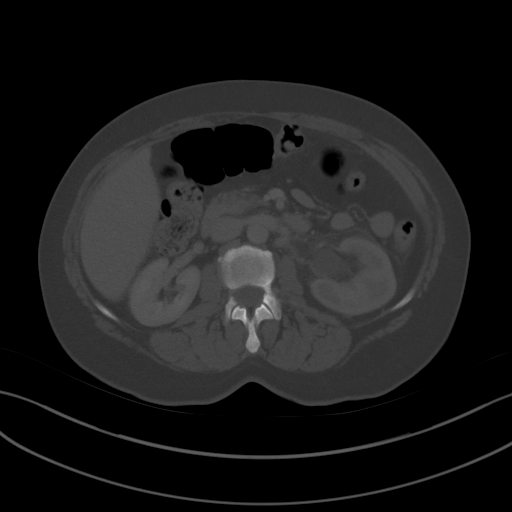
[im 63/89  soft-tissue]
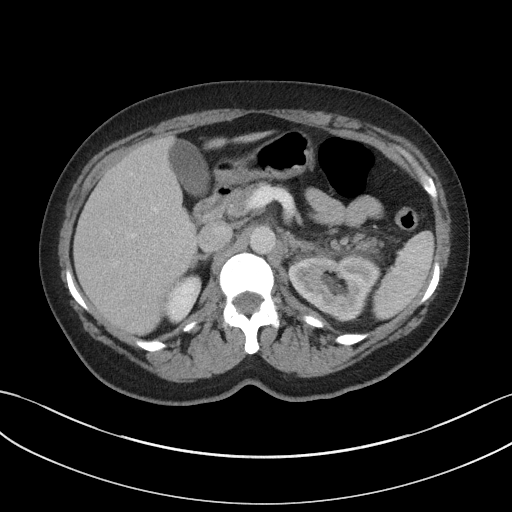
[im 68/89  soft-tissue]
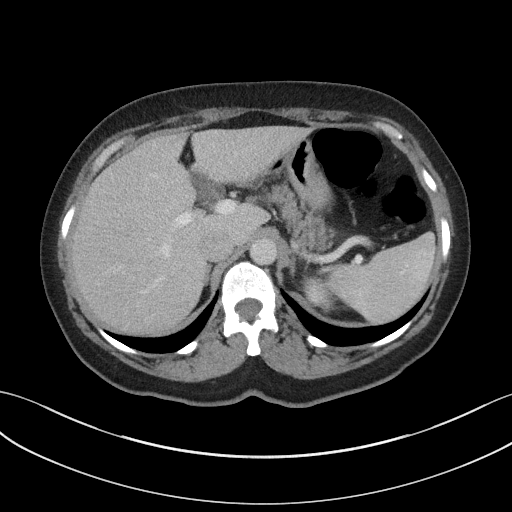
[im 78/89  soft-tissue]
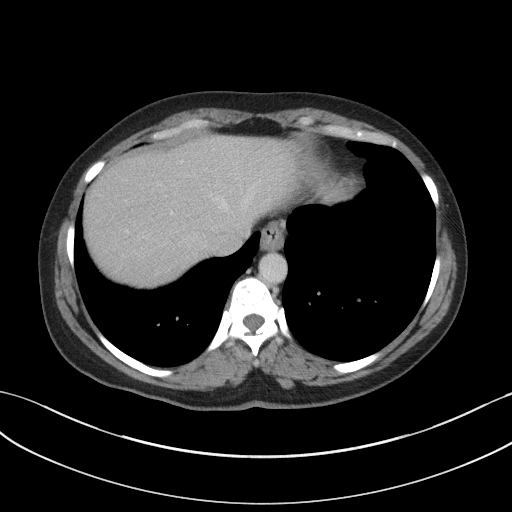
[im 83/89  soft-tissue]
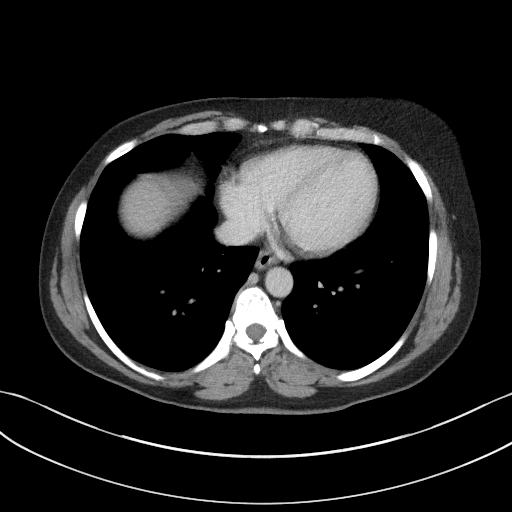

[Series 6: coronal st · coronal · 0.74mm/px · 3 of 79 slices shown]
[im 27/79  soft-tissue]
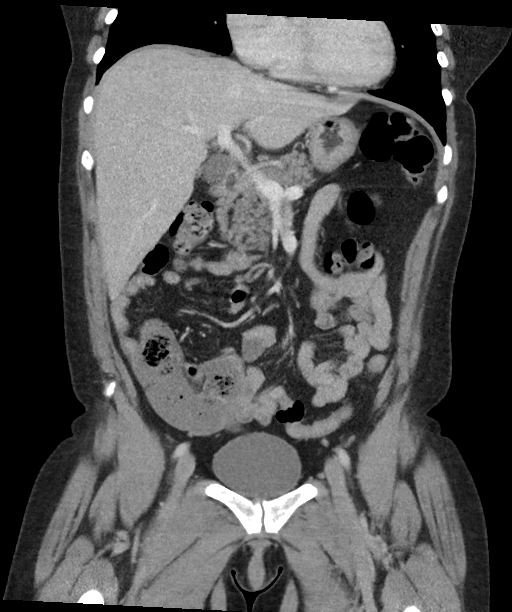
[im 35/79  soft-tissue]
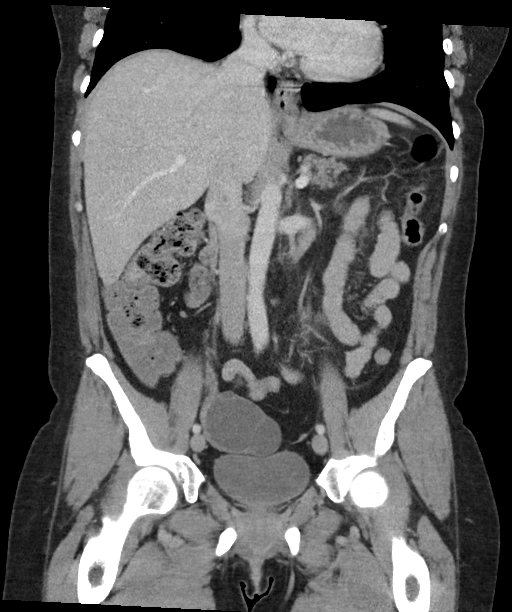
[im 44/79  soft-tissue]
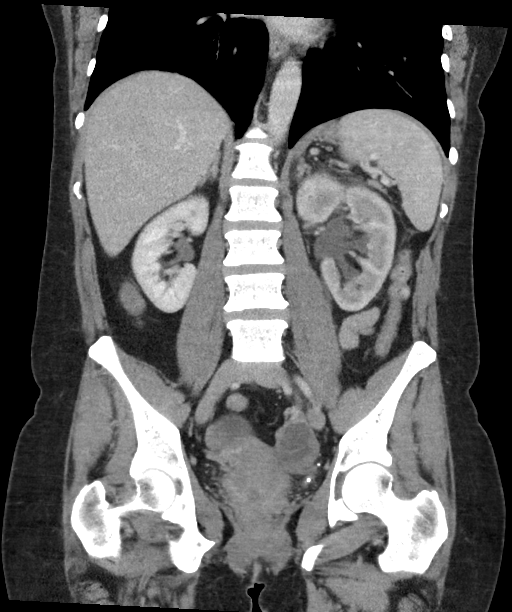

[16 of 46 positions shown; findings below may reference images not displayed]

FINDINGS: Lower chest: Lung bases clear

Hepatobiliary: Gallbladder and liver normal appearance

Pancreas: Normal appearance

Spleen: Normal appearance

Adrenals/Urinary Tract: Adrenal glands and RIGHT kidney normal
appearance. LEFT kidney appears mildly enlarged with delayed
nephrogram and mild perinephric edema. Associated LEFT
hydronephrosis and hydroureter deviation of the distal LEFT ureter
around a LEFT ovarian mass, see below. Small pelvic phleboliths are
seen within additional calcification image 69 which could be within
or immediately adjacent to the distal LEFT ureter. Bladder
unremarkable.

Stomach/Bowel: Normal appendix. Stomach and bowel loops
unremarkable.

Vascular/Lymphatic: Aorta normal caliber.  No adenopathy.

Reproductive: Retroverted uterus. BILATERAL cystic ovarian masses
are identified. Cystic RIGHT ovarian mass with a septation 5.6 x
x 4.2 cm. Complex cystic mass with multiple irregular/thick
septations and question nodularity within LEFT ovary, 5.7 x 3.6 x
4.6 cm. Findings are highly concerning for cystic ovarian neoplasm
on the LEFT and indeterminate for cystic ovarian neoplasm on the
RIGHT.

Other: No free intraperitoneal air air or fluid. No definite
peritoneal studding or omental infiltration. No hernia.

Musculoskeletal: Nonspecific small low-attenuation foci within
several vertebral bodies.
IMPRESSION: BILATERAL complex cystic masses of the ovaries measuring up to
cm RIGHT and 5.7 cm LEFT, highly suspicious for a cystic LEFT
ovarian neoplasm and indeterminate for cystic RIGHT ovarian
neoplasm; further evaluation by transabdominal and transvaginal
sonography of the pelvis recommended.

Distal LEFT ureteral obstruction with LEFT hydronephrosis,
hydroureter, impaired LEFT renal function, and LEFT perinephric
edema, uncertain if due to the visualized LEFT ovarian mass or a
potential small distal LEFT ureteral calculus.

Few small nonspecific low-attenuation foci within several vertebral
bodies, of uncertain significance.

## 2019-10-24 ENCOUNTER — Encounter: Payer: Self-pay | Admitting: Nurse Practitioner

## 2019-10-24 ENCOUNTER — Ambulatory Visit (INDEPENDENT_AMBULATORY_CARE_PROVIDER_SITE_OTHER): Payer: 59 | Admitting: Nurse Practitioner

## 2019-10-24 ENCOUNTER — Other Ambulatory Visit: Payer: Self-pay

## 2019-10-24 VITALS — BP 129/88 | HR 50 | Temp 97.8°F | Resp 16 | Ht 62.0 in | Wt 140.0 lb

## 2019-10-24 DIAGNOSIS — E559 Vitamin D deficiency, unspecified: Secondary | ICD-10-CM

## 2019-10-24 DIAGNOSIS — R011 Cardiac murmur, unspecified: Secondary | ICD-10-CM | POA: Diagnosis not present

## 2019-10-24 DIAGNOSIS — R7303 Prediabetes: Secondary | ICD-10-CM | POA: Diagnosis not present

## 2019-10-24 DIAGNOSIS — N6002 Solitary cyst of left breast: Secondary | ICD-10-CM | POA: Diagnosis not present

## 2019-10-24 DIAGNOSIS — Z1239 Encounter for other screening for malignant neoplasm of breast: Secondary | ICD-10-CM

## 2019-10-24 DIAGNOSIS — E7800 Pure hypercholesterolemia, unspecified: Secondary | ICD-10-CM | POA: Diagnosis not present

## 2019-10-24 DIAGNOSIS — Z0000 Encounter for general adult medical examination without abnormal findings: Secondary | ICD-10-CM

## 2019-10-24 NOTE — Assessment & Plan Note (Signed)
Continue Vitamin D 1000 units every day and recheck level today.  Goal >30.

## 2019-10-24 NOTE — Assessment & Plan Note (Signed)
Mammogram ordered

## 2019-10-24 NOTE — Assessment & Plan Note (Signed)
Systolic Grade 1 with no radiation.  Pt reports this has been ongoing, noted at [facility] and no recommendation for follow-up.  They had reported they would monitor clinically.  Pt asymptomatic.

## 2019-10-24 NOTE — Patient Instructions (Addendum)
[facility]  Address: [address], [place], [place] [address]  Phone: [phone]  Healthy Eating Following a healthy eating pattern may help you to achieve and maintain a healthy body weight, reduce the risk of chronic disease, and live a long and productive life. It is important to follow a healthy eating pattern at an appropriate calorie level for your body. Your nutritional needs should be met primarily through food by choosing a variety of nutrient-rich foods. What are tips for following this plan? Reading food labels  Read labels and choose the following: ? Reduced or low sodium. ? Juices with 100% fruit juice. ? Foods with low saturated fats and high polyunsaturated and monounsaturated fats. ? Foods with whole grains, such as whole wheat, cracked wheat, brown rice, and wild rice. ? Whole grains that are fortified with folic acid. This is recommended for women who are pregnant or who want to become pregnant.  Read labels and avoid the following: ? Foods with a lot of added sugars. These include foods that contain brown sugar, corn sweetener, corn syrup, dextrose, fructose, glucose, high-fructose corn syrup, honey, invert sugar, lactose, malt syrup, maltose, molasses, raw sugar, sucrose, trehalose, or turbinado sugar.  Do not eat more than the following amounts of added sugar per day:  6 teaspoons (25 g) for women.  9 teaspoons (38 g) for men. ? Foods that contain processed or refined starches and grains. ? Refined grain products, such as white flour, degermed cornmeal, white bread, and white rice. Shopping  Choose nutrient-rich snacks, such as vegetables, whole fruits, and nuts. Avoid high-calorie and high-sugar snacks, such as potato chips, fruit snacks, and candy.  Use oil-based dressings and spreads on foods instead of solid fats such as butter, stick margarine, or cream cheese.  Limit pre-made sauces, mixes, and "instant" products  such as flavored rice, instant noodles, and ready-made pasta.  Try more plant-protein sources, such as tofu, tempeh, black beans, edamame, lentils, nuts, and seeds.  Explore eating plans such as the Mediterranean diet or vegetarian diet. Cooking  Use oil to saut or stir-fry foods instead of solid fats such as butter, stick margarine, or lard.  Try baking, boiling, grilling, or broiling instead of frying.  Remove the fatty part of meats before cooking.  Steam vegetables in water or broth. Meal planning   At meals, imagine dividing your plate into fourths: ? One-half of your plate is fruits and vegetables. ? One-fourth of your plate is whole grains. ? One-fourth of your plate is protein, especially lean meats, poultry, eggs, tofu, beans, or nuts.  Include low-fat dairy as part of your daily diet. Lifestyle  Choose healthy options in all settings, including home, work, school, restaurants, or stores.  Prepare your food safely: ? Wash your hands after handling raw meats. ? Keep food preparation surfaces clean by regularly washing with hot, soapy water. ? Keep raw meats separate from ready-to-eat foods, such as fruits and vegetables. ? Cook seafood, meat, poultry, and eggs to the recommended internal temperature. ? Store foods at safe temperatures. In general:  Keep cold foods at 40F (4.4C) or below.  Keep hot foods at 140F (60C) or above.  Keep your freezer at 0F (-17.8C) or below.  Foods are no longer safe to eat when they have been between the temperatures of 40-140F (4.4-60C) for more than 2 hours. What foods should I eat? Fruits Aim to eat 2 cup-equivalents of fresh, canned (in natural juice), or frozen fruits   fruits each day. Examples of 1 cup-equivalent of fruit include 1 small apple, 8 large strawberries, 1 cup canned fruit,  cup dried fruit, or 1 cup 100% juice. Vegetables Aim to eat 2-3 cup-equivalents of fresh and frozen vegetables each day, including different  varieties and colors. Examples of 1 cup-equivalent of vegetables include 2 medium carrots, 2 cups raw, leafy greens, 1 cup chopped vegetable (raw or cooked), or 1 medium baked potato. Grains Aim to eat 6 ounce-equivalents of whole grains each day. Examples of 1 ounce-equivalent of grains include 1 slice of bread, 1 cup ready-to-eat cereal, 3 cups popcorn, or  cup cooked rice, pasta, or cereal. Meats and other proteins Aim to eat 5-6 ounce-equivalents of protein each day. Examples of 1 ounce-equivalent of protein include 1 egg, 1/2 cup nuts or seeds, or 1 tablespoon (16 g) peanut butter. A cut of meat or fish that is the size of a deck of cards is about 3-4 ounce-equivalents.  Of the protein you eat each week, try to have at least 8 ounces come from seafood. This includes salmon, trout, herring, and anchovies. Dairy Aim to eat 3 cup-equivalents of fat-free or low-fat dairy each day. Examples of 1 cup-equivalent of dairy include 1 cup (240 mL) milk, 8 ounces (250 g) yogurt, 1 ounces (44 g) natural cheese, or 1 cup (240 mL) fortified soy milk. Fats and oils  Aim for about 5 teaspoons (21 g) per day. Choose monounsaturated fats, such as canola and olive oils, avocados, peanut butter, and most nuts, or polyunsaturated fats, such as sunflower, corn, and soybean oils, walnuts, pine nuts, sesame seeds, sunflower seeds, and flaxseed. Beverages  Aim for six 8-oz glasses of water per day. Limit coffee to three to five 8-oz cups per day.  Limit caffeinated beverages that have added calories, such as soda and energy drinks.  Limit alcohol intake to no more than 1 drink a day for nonpregnant women and 2 drinks a day for men. One drink equals 12 oz of beer (355 mL), 5 oz of wine (148 mL), or 1 oz of hard liquor (44 mL). Seasoning and other foods  Avoid adding excess amounts of salt to your foods. Try flavoring foods with herbs and spices instead of salt.  Avoid adding sugar to foods.  Try using  oil-based dressings, sauces, and spreads instead of solid fats. This information is based on general [place] nutrition guidelines. For more information, visit [employer]. Exact amounts may vary based on your nutrition needs. Summary  A healthy eating plan may help you to maintain a healthy weight, reduce the risk of chronic diseases, and stay active throughout your life.  Plan your meals. Make sure you eat the right portions of a variety of nutrient-rich foods.  Try baking, boiling, grilling, or broiling instead of frying.  Choose healthy options in all settings, including home, work, school, restaurants, or stores. This information is not intended to replace advice given to you by your health care provider. Make sure you discuss any questions you have with your health care provider. Document Revised: 04/09/2017 Document Reviewed: 04/09/2017 Elsevier Patient Education  [employer].

## 2019-10-24 NOTE — Assessment & Plan Note (Addendum)
Check lipid panel today and initiate medication as needed.  Continue diet and exercise focus at home.

## 2019-10-24 NOTE — Assessment & Plan Note (Signed)
A1C 5.7 last visit, recommend focus on diet and exercise at this time.  Recheck annually.

## 2019-10-24 NOTE — Progress Notes (Signed)
BP 129/88 (BP Location: Left Arm, Patient Position: Sitting, Cuff Size: Normal)    Pulse (!) 50    Temp 97.8 F (36.6 C) (Oral)    Resp 16    Ht 5\' 2"  (1.575 m)    Wt 140 lb (63.5 kg)    LMP 03/01/2017    SpO2 98%    BMI 25.61 kg/m    Subjective:    Patient ID: Julie Warren, female    DOB: 1965-11-16, 54 y.o.   MRN: [id number]  HPI: Julie Warren is a 54 y.o. female presenting on 10/24/2019 for comprehensive medical examination. Current medical complaints include:none  She currently lives with: husband Menopausal Symptoms: no  Depression Screen done today and results listed below:  Depression screen [facility] 2/9 10/24/2019 11/27/2017 07/27/2014  Decreased Interest 0 0 0  Down, Depressed, Hopeless 0 0 0  PHQ - 2 Score 0 0 0  Altered sleeping - 0 -  Tired, decreased energy - 0 -  Change in appetite - 0 -  Feeling bad or failure about yourself  - 0 -  Trouble concentrating - 0 -  Moving slowly or fidgety/restless - 0 -  Suicidal thoughts - 0 -  PHQ-9 Score - 0 -  Difficult doing work/chores - Not difficult at all -    The patient does not have a history of falls. I did not complete a risk assessment for falls. A plan of care for falls was not documented.   Past Medical History:  Past Medical History:  Diagnosis Date   Acute renal failure (ARF) ([place]) 03/07/2017   Disorder of urinary stent insertion ([place]) 03/2017   IFG (impaired fasting glucose)    Vitamin D deficiency disease     Surgical History:  Past Surgical History:  Procedure Laterality Date   ABDOMINAL HYSTERECTOMY  03/27/2017   CESAREAN SECTION     CYSTOSCOPY W/ RETROGRADES Left 03/08/2017   Procedure: CYSTOSCOPY WITH RETROGRADE PYELOGRAM;  Surgeon: Nickie Retort, MD;  Location: [facility] ORS;  Service: Urology;  Laterality: Left;   CYSTOSCOPY W/ RETROGRADES Left 04/11/2017   Procedure: CYSTOSCOPY WITH RETROGRADE PYELOGRAM;  Surgeon: Hollice Espy, MD;  Location: [facility] OR[facility];  Service: Urology;  Laterality:  Left;   CYSTOSCOPY W/ URETERAL STENT REMOVAL Left 04/11/2017   Procedure: CYSTOSCOPY WITH STENT REMOVAL;  Surgeon: Hollice Espy, MD;  Location: [facility] OR[facility];  Service: Urology;  Laterality: Left;   CYSTOSCOPY WITH STENT PLACEMENT Left 03/08/2017   Procedure: CYSTOSCOPY WITH STENT PLACEMENT;  Surgeon: Nickie Retort, MD;  Location: [facility] OR[facility];  Service: Urology;  Laterality: Left;    Medications:  Current Outpatient Medications on File Prior to Visit  Medication Sig   ibuprofen (ADVIL,MOTRIN) 200 MG tablet Take 200-400 mg by mouth daily as needed for headache.   No current facility-administered medications on file prior to visit.    Allergies:  Allergies  Allergen Reactions   Percocet [Oxycodone-Acetaminophen] Nausea And Vomiting    Social History:  Social History   Socioeconomic History   Marital status: Married    Spouse name: Not on file   Number of children: Not on file   Years of education: Not on file   Highest education level: Not on file  Occupational History   Not on file  Tobacco Use   Smoking status: Never Smoker   Smokeless tobacco: Never Used  Vaping Use   Vaping Use: Never used  Substance and Sexual Activity   Alcohol use: No   Drug use: No  Sexual activity: Yes  Other Topics Concern   Not on file  Social History Narrative   Not on file   Social Determinants of Health   Financial Resource Strain:    Difficulty of Paying Living Expenses: Not on file  Food Insecurity:    Worried About [place] in the Last Year: Not on file   Ran Out of Food in the Last Year: Not on file  Transportation Needs:    Lack of Transportation (Medical): Not on file   Lack of Transportation (Non-Medical): Not on file  Physical Activity:    Days of Exercise per Week: Not on file   Minutes of Exercise per Session: Not on file  Stress:    Feeling of Stress : Not on file  Social Connections:    Frequency of Communication with Friends and  Family: Not on file   Frequency of Social Gatherings with Friends and Family: Not on file   Attends Religious Services: Not on file   Active Member of Clubs or Organizations: Not on file   Attends Archivist Meetings: Not on file   Marital Status: Not on file  Intimate Partner Violence:    Fear of Current or Ex-Partner: Not on file   Emotionally Abused: Not on file   Physically Abused: Not on file   Sexually Abused: Not on file   Social History   Tobacco Use  Smoking Status Never Smoker  Smokeless Tobacco Never Used   Social History   Substance and Sexual Activity  Alcohol Use No    Family History:  Family History  Problem Relation Age of Onset   Cancer Mother        lung   COPD Mother    Diabetes Father    Heart disease Father    Hyperlipidemia Father    Hypertension Father    COPD Maternal Aunt    Ovarian cancer Sister     Past medical history, surgical history, medications, allergies, family history and social history reviewed with patient today and changes made to appropriate areas of the chart.   Review of Systems - negative All other ROS negative except what is listed above and in the HPI.      Objective:    BP 129/88 (BP Location: Left Arm, Patient Position: Sitting, Cuff Size: Normal)    Pulse (!) 50    Temp 97.8 F (36.6 C) (Oral)    Resp 16    Ht 5\' 2"  (1.575 m)    Wt 140 lb (63.5 kg)    LMP 03/01/2017    SpO2 98%    BMI 25.61 kg/m   Wt Readings from Last 3 Encounters:  10/24/19 140 lb (63.5 kg)  12/09/18 143 lb (64.9 kg)  11/27/17 154 lb 6 oz (70 kg)    Physical Exam Constitutional:      General: She is awake. She is not in acute distress.    Appearance: She is well-developed. She is not ill-appearing.  HENT:     Head: Normocephalic and atraumatic.     Right Ear: Hearing, tympanic membrane, ear canal and external ear normal. No drainage.     Left Ear: Hearing, tympanic membrane, ear canal and external ear normal. No  drainage.     Nose: Nose normal.     Right Sinus: No maxillary sinus tenderness or frontal sinus tenderness.     Left Sinus: No maxillary sinus tenderness or frontal sinus tenderness.     Mouth/Throat:  Mouth: Mucous membranes are moist.     Pharynx: Oropharynx is clear. Uvula midline. No pharyngeal swelling, oropharyngeal exudate or posterior oropharyngeal erythema.  Eyes:     General: Lids are normal.        Right eye: No discharge.        Left eye: No discharge.     Extraocular Movements: Extraocular movements intact.     Conjunctiva/sclera: Conjunctivae normal.     Pupils: Pupils are equal, round, and reactive to light.     Visual Fields: Right eye visual fields normal and left eye visual fields normal.  Neck:     Thyroid: No thyromegaly.     Vascular: No carotid bruit.     Trachea: Trachea normal.  Cardiovascular:     Rate and Rhythm: Normal rate and regular rhythm.     Heart sounds: Murmur heard.  Systolic murmur is present with a grade of 1/6.  No gallop.   Pulmonary:     Effort: Pulmonary effort is normal. No accessory muscle usage or respiratory distress.     Breath sounds: Normal breath sounds.  Chest:     Breasts:        Right: Normal.        Left: Mass (known cyst at 12 o'clock left breast) present. No swelling, nipple discharge or tenderness.     Comments: Dense breast tissue on exam Abdominal:     General: Bowel sounds are normal.     Palpations: Abdomen is soft. There is no hepatomegaly or splenomegaly.     Tenderness: There is no abdominal tenderness.  Musculoskeletal:        General: Normal range of motion.     Cervical back: Normal range of motion and neck supple.     Right lower leg: No edema.     Left lower leg: No edema.  Lymphadenopathy:     Head:     Right side of head: No submental, submandibular, tonsillar, preauricular or posterior auricular adenopathy.     Left side of head: No submental, submandibular, tonsillar, preauricular or posterior  auricular adenopathy.     Cervical: No cervical adenopathy.     Upper Body:     Right upper body: No supraclavicular, axillary or pectoral adenopathy.     Left upper body: No supraclavicular, axillary or pectoral adenopathy.  Skin:    General: Skin is warm and dry.     Capillary Refill: Capillary refill takes less than 2 seconds.     Findings: No rash.  Neurological:     Mental Status: She is alert and oriented to person, place, and time.     Cranial Nerves: Cranial nerves are intact.     Gait: Gait is intact.     Deep Tendon Reflexes: Reflexes are normal and symmetric.     Reflex Scores:      Brachioradialis reflexes are 2+ on the right side and 2+ on the left side.      Patellar reflexes are 2+ on the right side and 2+ on the left side. Psychiatric:        Attention and Perception: Attention normal.        Mood and Affect: Mood normal.        Speech: Speech normal.        Behavior: Behavior normal. Behavior is cooperative.        Thought Content: Thought content normal.        Judgment: Judgment normal.     Results for orders placed  or performed in visit on 12/11/17  HM MAMMOGRAPHY  Result Value Ref Range   HM Mammogram 0-4 Bi-Rad 0-4 Bi-Rad, Self Reported Normal      Assessment & Plan:   Problem List Items Addressed This Visit      Other   Vitamin D deficiency disease    Continue Vitamin D 1000 units every day and recheck level today.  Goal >30.      Relevant Orders   VITAMIN D 25 Hydroxy (Vit-D Deficiency, Fractures)   Prediabetes    A1C 5.7 last visit, recommend focus on diet and exercise at this time.  Recheck annually.      Relevant Orders   TSH   HgB A1c   Benign breast cyst in female, left    Mammogram ordered.      Heart murmur    Systolic Grade 1 with no radiation.  Pt reports this has been ongoing, noted at [facility] and no recommendation for follow-up.  They had reported they would monitor clinically.  Pt asymptomatic.      Elevated cholesterol     Check lipid panel today and initiate medication as needed.  Continue diet and exercise focus at home.      Relevant Orders   Comprehensive metabolic panel   Lipid Panel w/o Chol/HDL Ratio    Other Visit Diagnoses    Routine general medical examination at a health care facility    -  Primary   Annual labs today to include CBC, CMP, TSH, lipid   Relevant Orders   CBC with Differential/Platelet   TSH   Breast screening       Mammogram ordered   Relevant Orders   MM DIGITAL SCREENING BILATERAL       Follow up plan: Return in about 1 year (around 10/23/2020) for Annual physical.   LABORATORY TESTING:  - Pap smear: total hysterectomy -- 03/27/2017, no cervix -- done at [facility]  IMMUNIZATIONS:   - Tdap: Tetanus vaccination status reviewed: last tetanus booster within 10 years. - Influenza: Refused - Pneumovax: Not applicable - Prevnar: Not applicable - HPV: Not applicable - Zostavax vaccine: Refused  SCREENING: -Mammogram: Up to date  - Colonoscopy: wants to check with insurance about costs for colonoscopy and cologuard  - Bone Density: Not applicable  -Hearing Test: Not applicable  -Spirometry: Not applicable   PATIENT COUNSELING:   Advised to take 1 mg of folate supplement per day if capable of pregnancy.   Sexuality: Discussed sexually transmitted diseases, partner selection, use of condoms, avoidance of unintended pregnancy  and contraceptive alternatives.   Advised to avoid cigarette smoking.  I discussed with the patient that most people either abstain from alcohol or drink within safe limits (<=14/week and <=4 drinks/occasion for males, <=7/weeks and <= 3 drinks/occasion for females) and that the risk for alcohol disorders and other health effects rises proportionally with the number of drinks per week and how often a drinker exceeds daily limits.  Discussed cessation/primary prevention of drug use and availability of treatment for abuse.   Diet: Encouraged to adjust  caloric intake to maintain  or achieve ideal body weight, to reduce intake of dietary saturated fat and total fat, to limit sodium intake by avoiding high sodium foods and not adding table salt, and to maintain adequate dietary potassium and calcium preferably from fresh fruits, vegetables, and low-fat dairy products.    stressed the importance of regular exercise  Injury prevention: Discussed safety belts, safety helmets, smoke detector, smoking near bedding or  upholstery.   Dental health: Discussed importance of regular tooth brushing, flossing, and dental visits.    NEXT PREVENTATIVE PHYSICAL DUE IN 1 YEAR. Return in about 1 year (around 10/23/2020) for Annual physical.

## 2019-10-25 LAB — CBC WITH DIFFERENTIAL/PLATELET
Basophils Absolute: 0 10*3/uL (ref 0.0–0.2)
Basos: 1 %
EOS (ABSOLUTE): 0.1 10*3/uL (ref 0.0–0.4)
Eos: 2 %
Hematocrit: 40.7 % (ref 34.0–46.6)
Hemoglobin: 14 g/dL (ref 11.1–15.9)
Immature Grans (Abs): 0 10*3/uL (ref 0.0–0.1)
Immature Granulocytes: 0 %
Lymphocytes Absolute: 2.7 10*3/uL (ref 0.7–3.1)
Lymphs: 44 %
MCH: 30.6 pg (ref 26.6–33.0)
MCHC: 34.4 g/dL (ref 31.5–35.7)
MCV: 89 fL (ref 79–97)
Monocytes Absolute: 0.4 10*3/uL (ref 0.1–0.9)
Monocytes: 7 %
Neutrophils Absolute: 2.8 10*3/uL (ref 1.4–7.0)
Neutrophils: 46 %
Platelets: 166 10*3/uL (ref 150–450)
RBC: 4.57 x10E6/uL (ref 3.77–5.28)
RDW: 12.9 % (ref 11.7–15.4)
WBC: 6.1 10*3/uL (ref 3.4–10.8)

## 2019-10-25 LAB — LIPID PANEL W/O CHOL/HDL RATIO
Cholesterol, Total: 211 mg/dL — ABNORMAL HIGH (ref 100–199)
HDL: 37 mg/dL — ABNORMAL LOW (ref >39)
LDL Chol Calc (NIH): 147 mg/dL — ABNORMAL HIGH (ref 0–99)
Triglycerides: 148 mg/dL (ref 0–149)
VLDL Cholesterol Cal: 27 mg/dL (ref 5–40)

## 2019-10-25 LAB — COMPREHENSIVE METABOLIC PANEL
ALT: 13 IU/L (ref 0–32)
AST: 19 IU/L (ref 0–40)
Albumin/Globulin Ratio: 1.7 (ref 1.2–2.2)
Albumin: 4.5 g/dL (ref 3.8–4.9)
Alkaline Phosphatase: 115 IU/L (ref 44–121)
BUN/Creatinine Ratio: 17 (ref 9–23)
BUN: 14 mg/dL (ref 6–24)
Bilirubin Total: 0.6 mg/dL (ref 0.0–1.2)
CO2: 25 mmol/L (ref 20–29)
Calcium: 9.5 mg/dL (ref 8.7–10.2)
Chloride: 101 mmol/L (ref 96–106)
Creatinine, Ser: 0.82 mg/dL (ref 0.57–1.00)
GFR calc Af Amer: 94 mL/min/{1.73_m2} (ref >59)
GFR calc non Af Amer: 81 mL/min/{1.73_m2} (ref >59)
Globulin, Total: 2.6 g/dL (ref 1.5–4.5)
Glucose: 80 mg/dL (ref 65–99)
Potassium: 3.8 mmol/L (ref 3.5–5.2)
Sodium: 140 mmol/L (ref 134–144)
Total Protein: 7.1 g/dL (ref 6.0–8.5)

## 2019-10-25 LAB — HEMOGLOBIN A1C
Est. average glucose Bld gHb Est-mCnc: 120 mg/dL
Hgb A1c MFr Bld: 5.8 % — ABNORMAL HIGH (ref 4.8–5.6)

## 2019-10-25 LAB — VITAMIN D 25 HYDROXY (VIT D DEFICIENCY, FRACTURES): Vit D, 25-Hydroxy: 26.1 ng/mL — ABNORMAL LOW (ref 30.0–100.0)

## 2019-10-25 LAB — TSH: TSH: 1.6 u[IU]/mL (ref 0.450–4.500)

## 2019-10-26 NOTE — Progress Notes (Signed)
Contacted via MyChart The 10-year ASCVD risk score Mikey Bussing DC Jr., et al., 2013) is: 2.9%   Values used to calculate the score:     Age: 54 years     Sex: Female     Is Non-Hispanic African American: No     Diabetic: No     Tobacco smoker: No     Systolic Blood Pressure: 798 mmHg     Is BP treated: No     HDL Cholesterol: 37 mg/dL     Total Cholesterol: 211 mg/dL  Good morning Ms. Ging your labs have returned: - CBC, kidney, liver, electrolytes, and thyroid testing are all normal. - A1C continues to show prediabetes, continue to focus on diet and regular exercise to avoid this entering diabetic range of 6.5% or greater.:) - Vitamin D level is on lower side. I recommend taking Vitamin D3 1000 to 2000 units daily which you can get in any vitamin section, this is good for overall bone and muscle health with aging.   - Cholesterol levels remain elevated, but overall risk score for stroke or heart attack on lower side.  No medication needed at this time, but we will continue to monitor.  Recommend heavy focus on diet and exercise.  Your LDL is above normal. The LDL is the bad cholesterol. Over time and in combination with inflammation and other factors, this contributes to plaque which in turn may lead to stroke and/or heart attack down the road. Sometimes high LDL is primarily genetic, and people might be eating all the right foods but still have high numbers. Other times, there is room for improvement in one's diet and eating healthier can bring this number down and potentially reduce one's risk of heart attack and/or stroke.   To reduce your LDL, Remember - more fruits and vegetables, more fish, and limit red meat and dairy products. More soy, nuts, beans, barley, lentils, oats and plant sterol ester enriched margarine instead of butter. I also encourage eliminating sugar and processed food. Remember, shop on the outside of the grocery store and visit your [employer].   Any  questions? Keep being awesome!!  Thank you for allowing me to participate in your care. Kindest regards, Wilma Michaelson

## 2020-01-08 ENCOUNTER — Other Ambulatory Visit: Payer: Self-pay

## 2020-01-08 ENCOUNTER — Ambulatory Visit
Admission: RE | Admit: 2020-01-08 | Discharge: 2020-01-08 | Disposition: A | Discharge status: Home / Self Care | Payer: 59 | Source: Ambulatory Visit | Attending: Nurse Practitioner | Admitting: Nurse Practitioner

## 2020-01-08 DIAGNOSIS — Z1231 Encounter for screening mammogram for malignant neoplasm of breast: Secondary | ICD-10-CM | POA: Insufficient documentation

## 2020-01-08 DIAGNOSIS — Z1239 Encounter for other screening for malignant neoplasm of breast: Secondary | ICD-10-CM

## 2023-09-02 NOTE — Patient Instructions (Incomplete)

## 2023-09-13 ENCOUNTER — Ambulatory Visit: Payer: Self-pay | Admitting: Nurse Practitioner
